# Patient Record
Sex: Female | Born: 1990 | Race: Black or African American | Hispanic: No | Marital: Married | Smoking: Never smoker
Health system: Southern US, Community
[De-identification: ages and names within clinical notes are randomized; demographics above are authoritative.]

## PROBLEM LIST (undated history)

## (undated) DIAGNOSIS — D696 Thrombocytopenia, unspecified: Secondary | ICD-10-CM

## (undated) DIAGNOSIS — G43909 Migraine, unspecified, not intractable, without status migrainosus: Secondary | ICD-10-CM

## (undated) DIAGNOSIS — B509 Plasmodium falciparum malaria, unspecified: Secondary | ICD-10-CM

## (undated) HISTORY — DX: Thrombocytopenia, unspecified: D69.6

## (undated) HISTORY — PX: NO PAST SURGERIES: SHX2092

## (undated) HISTORY — DX: Plasmodium falciparum malaria, unspecified: B50.9

---

## 2016-07-03 ENCOUNTER — Inpatient Hospital Stay (HOSPITAL_BASED_OUTPATIENT_CLINIC_OR_DEPARTMENT_OTHER)
Admission: EM | Admit: 2016-07-03 | Discharge: 2016-07-06 | DRG: 867 | Disposition: A | Discharge status: Home / Self Care | Payer: BLUE CROSS/BLUE SHIELD | Attending: Family Medicine | Admitting: Family Medicine

## 2016-07-03 ENCOUNTER — Emergency Department (HOSPITAL_BASED_OUTPATIENT_CLINIC_OR_DEPARTMENT_OTHER): Payer: BLUE CROSS/BLUE SHIELD

## 2016-07-03 ENCOUNTER — Encounter (HOSPITAL_BASED_OUTPATIENT_CLINIC_OR_DEPARTMENT_OTHER): Payer: Self-pay | Admitting: *Deleted

## 2016-07-03 DIAGNOSIS — E876 Hypokalemia: Secondary | ICD-10-CM | POA: Diagnosis present

## 2016-07-03 DIAGNOSIS — E871 Hypo-osmolality and hyponatremia: Secondary | ICD-10-CM | POA: Diagnosis present

## 2016-07-03 DIAGNOSIS — D62 Acute posthemorrhagic anemia: Secondary | ICD-10-CM | POA: Diagnosis present

## 2016-07-03 DIAGNOSIS — G43909 Migraine, unspecified, not intractable, without status migrainosus: Secondary | ICD-10-CM | POA: Diagnosis present

## 2016-07-03 DIAGNOSIS — D696 Thrombocytopenia, unspecified: Secondary | ICD-10-CM | POA: Diagnosis present

## 2016-07-03 DIAGNOSIS — G9340 Encephalopathy, unspecified: Secondary | ICD-10-CM | POA: Diagnosis present

## 2016-07-03 DIAGNOSIS — R600 Localized edema: Secondary | ICD-10-CM | POA: Diagnosis not present

## 2016-07-03 DIAGNOSIS — R609 Edema, unspecified: Secondary | ICD-10-CM

## 2016-07-03 DIAGNOSIS — B54 Unspecified malaria: Secondary | ICD-10-CM | POA: Diagnosis not present

## 2016-07-03 DIAGNOSIS — N920 Excessive and frequent menstruation with regular cycle: Secondary | ICD-10-CM | POA: Diagnosis present

## 2016-07-03 DIAGNOSIS — E872 Acidosis: Secondary | ICD-10-CM | POA: Diagnosis present

## 2016-07-03 DIAGNOSIS — R112 Nausea with vomiting, unspecified: Secondary | ICD-10-CM

## 2016-07-03 DIAGNOSIS — R946 Abnormal results of thyroid function studies: Secondary | ICD-10-CM | POA: Diagnosis not present

## 2016-07-03 DIAGNOSIS — R110 Nausea: Secondary | ICD-10-CM | POA: Diagnosis present

## 2016-07-03 DIAGNOSIS — B509 Plasmodium falciparum malaria, unspecified: Principal | ICD-10-CM | POA: Diagnosis present

## 2016-07-03 DIAGNOSIS — R51 Headache: Secondary | ICD-10-CM

## 2016-07-03 HISTORY — DX: Migraine, unspecified, not intractable, without status migrainosus: G43.909

## 2016-07-03 LAB — CBC WITH DIFFERENTIAL/PLATELET
Basophils Absolute: 0 10*3/uL (ref 0.0–0.1)
Basophils Relative: 0 %
EOS ABS: 0 10*3/uL (ref 0.0–0.7)
Eosinophils Relative: 0 %
HCT: 35.4 % — ABNORMAL LOW (ref 36.0–46.0)
Hemoglobin: 12.6 g/dL (ref 12.0–15.0)
LYMPHS ABS: 0.7 10*3/uL (ref 0.7–4.0)
Lymphocytes Relative: 11 %
MCH: 30.4 pg (ref 26.0–34.0)
MCHC: 35.6 g/dL (ref 30.0–36.0)
MCV: 85.3 fL (ref 78.0–100.0)
Monocytes Absolute: 0.6 10*3/uL (ref 0.1–1.0)
Monocytes Relative: 9 %
NEUTROS ABS: 5.4 10*3/uL (ref 1.7–7.7)
NEUTROS PCT: 80 %
PLATELETS: 28 10*3/uL — AB (ref 150–400)
RBC: 4.15 MIL/uL (ref 3.87–5.11)
RDW: 12.1 % (ref 11.5–15.5)
WBC: 6.8 10*3/uL (ref 4.0–10.5)

## 2016-07-03 LAB — URINALYSIS, ROUTINE W REFLEX MICROSCOPIC
BILIRUBIN URINE: NEGATIVE
Glucose, UA: NEGATIVE mg/dL
Leukocytes, UA: NEGATIVE
NITRITE: NEGATIVE
Protein, ur: 30 mg/dL — AB
SPECIFIC GRAVITY, URINE: 1.005 (ref 1.005–1.030)
pH: 5.5 (ref 5.0–8.0)

## 2016-07-03 LAB — COMPREHENSIVE METABOLIC PANEL
ALT: 42 U/L (ref 14–54)
ANION GAP: 12 (ref 5–15)
AST: 50 U/L — ABNORMAL HIGH (ref 15–41)
Albumin: 3.1 g/dL — ABNORMAL LOW (ref 3.5–5.0)
Alkaline Phosphatase: 82 U/L (ref 38–126)
BUN: 12 mg/dL (ref 6–20)
CHLORIDE: 103 mmol/L (ref 101–111)
CO2: 16 mmol/L — ABNORMAL LOW (ref 22–32)
CREATININE: 0.54 mg/dL (ref 0.44–1.00)
Calcium: 8.1 mg/dL — ABNORMAL LOW (ref 8.9–10.3)
Glucose, Bld: 91 mg/dL (ref 65–99)
POTASSIUM: 2.8 mmol/L — AB (ref 3.5–5.1)
Sodium: 131 mmol/L — ABNORMAL LOW (ref 135–145)
Total Bilirubin: 1.8 mg/dL — ABNORMAL HIGH (ref 0.3–1.2)
Total Protein: 6.4 g/dL — ABNORMAL LOW (ref 6.5–8.1)

## 2016-07-03 LAB — URINALYSIS, MICROSCOPIC (REFLEX)

## 2016-07-03 LAB — SAVE SMEAR

## 2016-07-03 LAB — HCG, SERUM, QUALITATIVE: PREG SERUM: NEGATIVE

## 2016-07-03 LAB — TSH: TSH: 0.279 u[IU]/mL — ABNORMAL LOW (ref 0.350–4.500)

## 2016-07-03 LAB — PARASITE EXAM SCREEN, BLOOD-W CONF TO LABCORP (NOT @ ARMC)

## 2016-07-03 LAB — I-STAT CG4 LACTIC ACID, ED: Lactic Acid, Venous: 1.11 mmol/L (ref 0.5–1.9)

## 2016-07-03 MED ORDER — SODIUM CHLORIDE 0.9 % IV BOLUS (SEPSIS)
1000.0000 mL | Freq: Once | INTRAVENOUS | Status: AC
  Administered 2016-07-03: 1000 mL via INTRAVENOUS

## 2016-07-03 MED ORDER — SODIUM CHLORIDE 0.9 % IV SOLN: INTRAVENOUS | Status: DC

## 2016-07-03 MED ORDER — ARTEMETHER-LUMEFANTRINE 20-120 MG PO TABS
4.0000 | ORAL_TABLET | Freq: Two times a day (BID) | ORAL | Status: AC
  Administered 2016-07-04 – 2016-07-05 (×4): 4 via ORAL
  Filled 2016-07-03 (×4): qty 4

## 2016-07-03 MED ORDER — ONDANSETRON HCL 4 MG PO TABS: 4.0000 mg | ORAL_TABLET | Freq: Four times a day (QID) | ORAL | Status: DC | PRN

## 2016-07-03 MED ORDER — ACETAMINOPHEN 500 MG PO TABS
1000.0000 mg | ORAL_TABLET | Freq: Once | ORAL | Status: AC
  Administered 2016-07-03: 1000 mg via ORAL

## 2016-07-03 MED ORDER — ACETAMINOPHEN 500 MG PO TABS
ORAL_TABLET | ORAL | Status: AC
  Filled 2016-07-03: qty 2

## 2016-07-03 MED ORDER — POTASSIUM CHLORIDE IN NACL 40-0.9 MEQ/L-% IV SOLN
INTRAVENOUS | Status: DC
  Administered 2016-07-03 – 2016-07-04 (×2): 125 mL/h via INTRAVENOUS
  Filled 2016-07-03 (×4): qty 1000

## 2016-07-03 MED ORDER — BISACODYL 5 MG PO TBEC: 5.0000 mg | DELAYED_RELEASE_TABLET | Freq: Every day | ORAL | Status: DC | PRN

## 2016-07-03 MED ORDER — POTASSIUM CHLORIDE CRYS ER 20 MEQ PO TBCR
40.0000 meq | EXTENDED_RELEASE_TABLET | Freq: Four times a day (QID) | ORAL | Status: AC
  Administered 2016-07-03 (×2): 40 meq via ORAL
  Filled 2016-07-03 (×2): qty 2

## 2016-07-03 MED ORDER — ARTEMETHER-LUMEFANTRINE 20-120 MG PO TABS
4.0000 | ORAL_TABLET | Freq: Three times a day (TID) | ORAL | Status: AC
  Administered 2016-07-03 (×2): 4 via ORAL
  Filled 2016-07-03 (×3): qty 4

## 2016-07-03 MED ORDER — ALBUTEROL SULFATE (2.5 MG/3ML) 0.083% IN NEBU: 2.5000 mg | INHALATION_SOLUTION | RESPIRATORY_TRACT | Status: DC | PRN

## 2016-07-03 MED ORDER — ACETAMINOPHEN 650 MG RE SUPP: 650.0000 mg | Freq: Four times a day (QID) | RECTAL | Status: DC | PRN

## 2016-07-03 MED ORDER — ACETAMINOPHEN 325 MG PO TABS
650.0000 mg | ORAL_TABLET | Freq: Four times a day (QID) | ORAL | Status: DC | PRN
  Administered 2016-07-04 – 2016-07-05 (×4): 650 mg via ORAL
  Filled 2016-07-03 (×4): qty 2

## 2016-07-03 MED ORDER — ONDANSETRON HCL 4 MG/2ML IJ SOLN
4.0000 mg | Freq: Four times a day (QID) | INTRAMUSCULAR | Status: DC | PRN
  Administered 2016-07-03 – 2016-07-04 (×3): 4 mg via INTRAVENOUS
  Filled 2016-07-03 (×3): qty 2

## 2016-07-03 NOTE — ED Notes (Addendum)
Attempted to call report to floor.  RN will call back with questions

## 2016-07-03 NOTE — Consult Note (Addendum)
Date of Admission:  07/03/2016  Date of Consult:  07/03/2016  Reason for Consult: Malaria Referring Physician: Dr. Candiss Norse   HPI: Beth Rollins is an 26 y.o. female who travelled to [place] from [place]. She and her husband took malaria prophylaxis but the patient herself did not take while in [place] because she ran out of medication. There was reportedly problem with their insurance and copay of meds.She arrived back in the [place] in the past 2 days. 5 days prior to admission she was already feeling poorly with headaches and nausea. She came to ED at [facility].blood work with TTPenia, hemolytic anemia. Parasite smear showed malaria. Patient was [facility] to [facility].   Dr. Candiss Norse had tried to order COARTEM in epic but apparently it is not built in and even after pharmacy consult entered it took several hours (including prompting from me). She took her first doses of medications this afternoon when I entered the room.  I examined her thin smear set up at [facility] and I calculated 0.06% parasitemia (if only counting ringed IC RBC forms) . I observed one RBC with 2 ring forms. I also observed many RBC with stubs on surface which is characteristic of Falciparum malaria.          Past Medical History:  Diagnosis Date  . Migraine     History reviewed. No pertinent surgical history.  Social History:  reports that she has never smoked. She does not have any smokeless tobacco history on file. She reports that she does not drink alcohol or use drugs.   History reviewed. No pertinent family history.  No Known Allergies   Medications: I have reviewed patients current medications as documented in Epic Anti-infectives    Start     Dose/Rate Route Frequency Ordered Stop   07/04/16 1000  artemether-lumefantrine (COARTEM) 20-120 MG tablet 4 tablet     4 tablet Oral 2 times daily 07/03/16 1414 07/06/16 0959   07/03/16 1430  artemether-lumefantrine (COARTEM) 20-120 MG tablet 4 tablet     4  tablet Oral Every 8 hours 07/03/16 1414 07/04/16 0629         ROS:  as in HPI  + fevers, chills, HA, nausea, malais otherwise remainder of 12 point Review of Systems is negative Blood pressure 115/63, pulse 88, temperature 98 F (36.7 C), temperature source Axillary, resp. rate 18, height '5\' 3"'$  (1.6 m), weight 112 lb 7 oz (51 kg), last menstrual period 07/02/2016, SpO2 100 %. General: Alert and awake, oriented x3, not in any acute distress. HEENT: anicteric sclera,  EOMI, oropharynx clear and without exudate Cardiovascular: tachy ate, normal r,  no murmur rubs or gallops Pulmonary: clear to auscultation bilaterally, no wheezing, rales or rhonchi Gastrointestinal: soft nontender, nondistended, normal bowel sounds, Musculoskeletal: no  clubbing or edema noted bilaterally Skin, soft tissue: no rashes Neuro: nonfocal, strength and sensation intact   Results for orders placed or performed during the hospital encounter of 07/03/16 (from the past 48 hour(s))  Comprehensive metabolic panel     Status: Abnormal   Collection Time: 07/03/16  8:28 AM  Result Value Ref Range   Sodium 131 (L) 135 - 145 mmol/L   Potassium 2.8 (L) 3.5 - 5.1 mmol/L   Chloride 103 101 - 111 mmol/L   CO2 16 (L) 22 - 32 mmol/L   Glucose, Bld 91 65 - 99 mg/dL   BUN 12 6 - 20 mg/dL   Creatinine, Ser 0.54 0.44 - 1.00 mg/dL  Calcium 8.1 (L) 8.9 - 10.3 mg/dL   Total Protein 6.4 (L) 6.5 - 8.1 g/dL   Albumin 3.1 (L) 3.5 - 5.0 g/dL   AST 50 (H) 15 - 41 U/L   ALT 42 14 - 54 U/L   Alkaline Phosphatase 82 38 - 126 U/L   Total Bilirubin 1.8 (H) 0.3 - 1.2 mg/dL   GFR calc non Af Amer >60 >60 mL/min   GFR calc Af Amer >60 >60 mL/min    Comment: (NOTE) The eGFR has been calculated using the CKD EPI equation. This calculation has not been validated in all clinical situations. eGFR's persistently <60 mL/min signify possible Chronic Kidney Disease.    Anion gap 12 5 - 15  CBC with Differential     Status: Abnormal    Collection Time: 07/03/16  8:28 AM  Result Value Ref Range   WBC 6.8 4.0 - 10.5 K/uL   RBC 4.15 3.87 - 5.11 MIL/uL   Hemoglobin 12.6 12.0 - 15.0 g/dL   HCT 35.4 (L) 36.0 - 46.0 %   MCV 85.3 78.0 - 100.0 fL   MCH 30.4 26.0 - 34.0 pg   MCHC 35.6 30.0 - 36.0 g/dL   RDW 12.1 11.5 - 15.5 %   Platelets 28 (LL) 150 - 400 K/uL    Comment: REPEATED TO VERIFY PLATELET COUNT CONFIRMED BY SMEAR CRITICAL RESULT CALLED TO, READ BACK BY AND VERIFIED WITH: BURNS AMY RN [phone] [id number] PHILLIPS C    Neutrophils Relative % 80 %   Neutro Abs 5.4 1.7 - 7.7 K/uL   Lymphocytes Relative 11 %   Lymphs Abs 0.7 0.7 - 4.0 K/uL   Monocytes Relative 9 %   Monocytes Absolute 0.6 0.1 - 1.0 K/uL   Eosinophils Relative 0 %   Eosinophils Absolute 0.0 0.0 - 0.7 K/uL   Basophils Relative 0 %   Basophils Absolute 0.0 0.0 - 0.1 K/uL   WBC Morphology TOXIC GRANULATION     Comment: VACUOLATED NEUTROPHILS  Parasite Exam Screen, Blood-w conf to [facility] (Not @ [facility])     Status: None   Collection Time: 07/03/16  8:28 AM  Result Value Ref Range   Parasite Exam Screen, Blood      CRITICAL RESULT CALLED TO, READ BACK BY AND VERIFIED WITH:    Comment: A. HARTLEY,RN [address]        Plasmodium or other Blood Parasites seen on thin smears. Sent to Reference Laboratory for identification and speciation. A. [id number],HK [id number] BY Rhea Bleacher Performed at [facility], [place] [address], [place], [place] [address]   hCG, serum, qualitative     Status: None   Collection Time: 07/03/16  8:28 AM  Result Value Ref Range   Preg, Serum NEGATIVE NEGATIVE    Comment:        THE SENSITIVITY OF THIS METHODOLOGY IS >10 mIU/mL.   I-Stat CG4 Lactic Acid, ED     Status: None   Collection Time: 07/03/16  8:35 AM  Result Value Ref Range   Lactic Acid, Venous 1.11 0.5 - 1.9 mmol/L  Urinalysis, Routine w reflex microscopic     Status: Abnormal   Collection Time: 07/03/16 10:32 AM  Result Value Ref Range    Color, Urine YELLOW YELLOW   APPearance CLEAR CLEAR   Specific Gravity, Urine 1.005 1.005 - 1.030   pH 5.5 5.0 - 8.0   Glucose, UA NEGATIVE NEGATIVE mg/dL   Hgb urine dipstick LARGE (A) NEGATIVE  Bilirubin Urine NEGATIVE NEGATIVE   Ketones, ur >80 (A) NEGATIVE mg/dL   Protein, ur 30 (A) NEGATIVE mg/dL   Nitrite NEGATIVE NEGATIVE   Leukocytes, UA NEGATIVE NEGATIVE  Urinalysis, Microscopic (reflex)     Status: Abnormal   Collection Time: 07/03/16 10:32 AM  Result Value Ref Range   RBC / HPF 6-30 0 - 5 RBC/hpf   WBC, UA 0-5 0 - 5 WBC/hpf   Bacteria, UA FEW (A) NONE SEEN   Squamous Epithelial / LPF 0-5 (A) NONE SEEN   Mucous PRESENT   TSH     Status: Abnormal   Collection Time: 07/03/16 12:25 PM  Result Value Ref Range   TSH 0.279 (L) 0.350 - 4.500 uIU/mL    Comment: Performed by a 3rd Generation assay with a functional sensitivity of <=0.01 uIU/mL.     )No results found for this or any previous visit (from the past 720 hour(s)).   Impression/Recommendation  Principal Problem:   Malaria Active Problems:   Hypokalemia   Hyponatremia   Beth Rollins is a 26 y.o. female with  Falciparum malaria  #1 Falciparum malaria:   Continue COARTEM  followup repeat smear in the am  Repeat CBC  IT WOULD BE A HIGHER STANDARD OF CARE TO HAVE SOMEONE CALCULATING THE % RBC WHO LOOKS AT SMEARS ROUTINELY rather than have this sent out.  My calculation based on observed ringed parasitized forms was .06%   Supportive care  I spent greater than 80  minutes with the patient including greater than 50% of time in face to face counsel of the patient re her malaria and in coordination of her care with RN, primary team, pharmacy, lab and in counting of parasites and examination of the smear personally.       07/03/2016, 5:07 PM   Thank you so much for this interesting consult  [facility] [place] [phone] (pager) [phone]  (office) 07/03/2016, 5:07 PM  Rhina Brackett Dam 07/03/2016, 5:07 PM

## 2016-07-03 NOTE — Progress Notes (Signed)
Pt had taken the Coartem and then the gave PO potassium. Pat had 1 vomiting episode after taking one potassium pill. No sign of medicine in vomit

## 2016-07-03 NOTE — ED Provider Notes (Signed)
[facility] Provider Note   CSN: [id number] Arrival date & time: 07/03/16  0734     History   Chief Complaint Chief Complaint  Patient presents with  . Fatigue    HPI Beth Rollins is a 26 y.o. female.  HPI Patient presents with suspected malaria. States she just returned from [place] last night. She was visiting family members that are in a refugee camp. For the last 3-4 days she's had fever headaches myalgias. States it feels like when she is previously had malaria. States he did go to the refugee camp but do not know of any other disease that they were exposed to. No cough. States she just feels worn out.   Past Medical History:  Diagnosis Date  . Migraine     There are no active problems to display for this patient.   History reviewed. No pertinent surgical history.  OB History    No data available       Home Medications    Prior to Admission medications   Not on File    Family History History reviewed. No pertinent family history.  Social History Social History  Substance Use Topics  . Smoking status: Never Smoker  . Smokeless tobacco: Not on file  . Alcohol use No     Allergies   Patient has no known allergies.   Review of Systems Review of Systems  Constitutional: Positive for fatigue and fever. Negative for appetite change.  HENT: Negative for facial swelling and sore throat.   Respiratory: Positive for shortness of breath.   Gastrointestinal: Positive for nausea and vomiting. Negative for abdominal pain.  Genitourinary: Negative for flank pain.  Musculoskeletal: Positive for myalgias.  Skin: Negative for rash and wound.  Neurological: Negative for numbness.  Psychiatric/Behavioral: Negative for agitation.     Physical Exam Updated Vital Signs BP (!) 107/56 (BP Location: Left Arm)   Pulse 98   Temp 99.8 F (37.7 C)   Resp 18   Ht 5\' 3"  (1.6 m)   Wt 51 kg (112 lb 7 oz)   LMP 07/02/2016   SpO2 98%   BMI 19.92  kg/m   Physical Exam  Constitutional: She appears well-developed.  HENT:  Head: Atraumatic.  Mouth/Throat: No oropharyngeal exudate.  Neck: Neck supple.  Cardiovascular:  Tachycardia  Pulmonary/Chest: Effort normal.  Abdominal: There is no tenderness.  Spleen may be mildly enlarged.  Musculoskeletal: She exhibits no edema.  Neurological: She is alert.  Skin: Skin is warm. Capillary refill takes less than 2 seconds.  Psychiatric: She has a normal mood and affect.     ED Treatments / Results  Labs (all labs ordered are listed, but only abnormal results are displayed) Labs Reviewed  COMPREHENSIVE METABOLIC PANEL - Abnormal; Notable for the following:       Result Value   Sodium 131 (*)    Potassium 2.8 (*)    CO2 16 (*)    Calcium 8.1 (*)    Total Protein 6.4 (*)    Albumin 3.1 (*)    AST 50 (*)    Total Bilirubin 1.8 (*)    All other components within normal limits  CBC WITH DIFFERENTIAL/PLATELET - Abnormal; Notable for the following:    HCT 35.4 (*)    Platelets 28 (*)    All other components within normal limits  CULTURE, BLOOD (ROUTINE X 2)  CULTURE, BLOOD (ROUTINE X 2)  PARASITE EXAM SCREEN, BLOOD-W CONF TO [facility] (NOT @ [facility])  PREGNANCY, URINE  URINALYSIS, ROUTINE W REFLEX MICROSCOPIC  I-STAT CG4 LACTIC ACID, ED    EKG  EKG Interpretation None       Radiology Dg Chest 2 View  Result Date: 07/03/2016 CLINICAL DATA:  Fifteen fever 5 days. EXAM: CHEST  2 VIEW COMPARISON:  None. FINDINGS: The heart size and mediastinal contours are within normal limits. Both lungs are clear. The visualized skeletal structures are unremarkable. IMPRESSION: No active cardiopulmonary disease. Electronically Signed   By: Elige Ko   On: 07/03/2016 08:30    Procedures Procedures (including critical care time)  Medications Ordered in ED Medications  sodium chloride 0.9 % bolus 1,000 mL (1,000 mLs Intravenous New Bag/Given 07/03/16 0919)  acetaminophen (TYLENOL) tablet  1,000 mg (1,000 mg Oral Given 07/03/16 0753)  sodium chloride 0.9 % bolus 1,000 mL (0 mLs Intravenous Stopped 07/03/16 0919)     Initial Impression / Assessment and Plan / ED Course  I have reviewed the triage vital signs and the nursing notes.  Pertinent labs & imaging results that were available during my care of the patient were reviewed by me and considered in my medical decision making (see chart for details).   patient presented with suspected malaria. Just returned from [place]. Has had malaria in the past. Discussed with Dr. Ilsa Iha from infectious disease. Patient does have a thrombocytopenia. Still feels weak after initial IV fluids. At this point I think she would benefit from an admission. Normal lactic acid. Will admit to [facility]. Will be followed by Dr. Zenaida Niece dam. I do  not have anti-malaria drugs here and they will need to be started once arriving to [facility].  Final Clinical Impressions(s) / ED Diagnoses   Final diagnoses:  Malaria  Thrombocytopenia [facility])    New Prescriptions New Prescriptions   No medications on file     Benjiman Core, MD 07/03/16 [phone]

## 2016-07-03 NOTE — ED Triage Notes (Signed)
Pt reports that she returned from [place] last night. States that she thinks that she has malaria.  Fever, HA, body aches, nausea x 3-4 days.  Ambulatory. Denies other symptoms.

## 2016-07-03 NOTE — ED Notes (Signed)
Paged [facility] via [facility] @ 9:32 am

## 2016-07-03 NOTE — H&P (Signed)
[facility] H&P   Patient Demographics:    Beth Rollins, is a 26 y.o. female  MRN: [id number]   DOB - 30-Aug-1990  Admit Date - 07/03/2016  Outpatient Primary MD for the patient is Patient, No Pcp Per    Patient coming from: Home  Chief Complaint  Patient presents with  . Fatigue      HPI:    Beth Rollins  is a 26 y.o. female, No previous medical history, who is recently moved from [place] a few days ago, recently was at a refugee camp there is well visiting family. She was exposed to multiple mosquito bites. She presented to [facility] with chief complaints of fever which comes off and on every few hours associated with right years but no sweats, mostly headaches and mild nausea. In the ER blood work suggested severe thrombocytopenia, hypokalemia and hyponatremia. Case was discussed by the ER physician with ID physician Dr. Ilsa Iha and went them. Blood smear showed possible parasite per pathology technician, presumptive diagnosis of malaria was obtained and patient was admitted to [facility] under my service.  She currently has generalized headache, no body aches, no hematuria, no abdominal pain, no bruises. Besides above dictated review of systems other review of systems negative    Review of systems:    In addition to the HPI above,   Positive Fever-chills, +ve Headache, No changes with Vision or hearing, No problems swallowing food or Liquids, No Chest pain, Cough or Shortness of Breath, No Abdominal pain, No Nausea or Vommitting, Bowel movements are regular, No Blood in stool or Urine, No dysuria, No new skin rashes or bruises, No new joints pains-aches,  No new weakness, tingling, numbness in  any extremity, No recent weight gain or loss, No polyuria, polydypsia or polyphagia, No significant Mental Stressors.  A full 10 point Review of Systems was done, except as stated above, all other Review of Systems were negative.   With Past History of the following :    Past Medical History:  Diagnosis Date  . Migraine       History reviewed. No pertinent surgical history.    Social History:     Social History  Substance Use Topics  . Smoking status: Never Smoker  . Smokeless tobacco: Not on file  . Alcohol use No  Family History :   No history of hematological malignancies   Home Medications:   Prior to Admission medications   Not on File     Allergies:    No Known Allergies   Physical Exam:   Vitals  Blood pressure (!) 98/49, pulse 87, temperature 98.3 F (36.8 C), temperature source Axillary, resp. rate 20, height 5\' 3"  (1.6 m), weight 51 kg (112 lb 7 oz), last menstrual period 07/02/2016, SpO2 99 %.   1. General Young thin African-American female lying in hospital bed in no apparent distress,  2. Normal affect and insight, Not Suicidal or Homicidal, Awake Alert, Oriented X 3.  3. No F.N deficits, ALL C.Nerves Intact, Strength 5/5 all 4 extremities, Sensation intact all 4 extremities, Plantars down going.  4. Ears and Eyes appear Normal, Conjunctivae clear, PERRLA. Moist Oral Mucosa.  5. Supple Neck, No JVD, No cervical lymphadenopathy appriciated, No Carotid Bruits.  6. Symmetrical Chest wall movement, Good air movement bilaterally, CTAB.  7. RRR, No Gallops, Rubs or Murmurs, No Parasternal Heave.  8. Positive Bowel Sounds, Abdomen Soft, No tenderness, No organomegaly appriciated,No rebound -guarding or rigidity.  9.  No Cyanosis, Normal Skin Turgor, No Skin Rash or Bruise.  10. Good muscle tone,  joints appear normal , no effusions, Normal ROM.  11. No Palpable Lymph Nodes in Neck or Axillae      Data Review:    CBC  Recent  Labs Lab 07/03/16 0828  WBC 6.8  HGB 12.6  HCT 35.4*  PLT 28*  MCV 85.3  MCH 30.4  MCHC 35.6  RDW 12.1  LYMPHSABS 0.7  MONOABS 0.6  EOSABS 0.0  BASOSABS 0.0   ------------------------------------------------------------------------------------------------------------------  Chemistries   Recent Labs Lab 07/03/16 0828  NA 131*  K 2.8*  CL 103  CO2 16*  GLUCOSE 91  BUN 12  CREATININE 0.54  CALCIUM 8.1*  AST 50*  ALT 42  ALKPHOS 82  BILITOT 1.8*   ------------------------------------------------------------------------------------------------------------------ estimated creatinine clearance is 85 mL/min (by C-G formula based on SCr of 0.54 mg/dL). ------------------------------------------------------------------------------------------------------------------ No results for input(s): TSH, T4TOTAL, T3FREE, THYROIDAB in the last 72 hours.  Invalid input(s): FREET3  Coagulation profile No results for input(s): INR, PROTIME in the last 168 hours. ------------------------------------------------------------------------------------------------------------------- No results for input(s): DDIMER in the last 72 hours. -------------------------------------------------------------------------------------------------------------------  Cardiac Enzymes No results for input(s): CKMB, TROPONINI, MYOGLOBIN in the last 168 hours.  Invalid input(s): CK ------------------------------------------------------------------------------------------------------------------ No results found for: BNP   ---------------------------------------------------------------------------------------------------------------  Urinalysis    Component Value Date/Time   COLORURINE YELLOW 07/03/2016 1032   APPEARANCEUR CLEAR 07/03/2016 1032   LABSPEC 1.005 07/03/2016 1032   PHURINE 5.5 07/03/2016 1032   GLUCOSEU NEGATIVE 07/03/2016 1032   HGBUR LARGE (A) 07/03/2016 1032   BILIRUBINUR NEGATIVE  07/03/2016 1032   KETONESUR >80 (A) 07/03/2016 1032   PROTEINUR 30 (A) 07/03/2016 1032   NITRITE NEGATIVE 07/03/2016 1032   LEUKOCYTESUR NEGATIVE 07/03/2016 1032    ----------------------------------------------------------------------------------------------------------------   Imaging Results:    Dg Chest 2 View  Result Date: 07/03/2016 CLINICAL DATA:  Fifteen fever 5 days. EXAM: CHEST  2 VIEW COMPARISON:  None. FINDINGS: The heart size and mediastinal contours are within normal limits. Both lungs are clear. The visualized skeletal structures are unremarkable. IMPRESSION: No active cardiopulmonary disease. Electronically Signed   By: Elige KoHetal  Patel   On: 07/03/2016 08:30        Assessment & Plan:      1. Possible malaria. Cannot rule out dengue at this  time. Patient to be admitted, ID consulted, blood smear will be repeated, ID physician Dr. Algis Liming will evaluate the smear. 4 dengue that is supportive care which will be continued, it screen will be done, if platelet counts drop dangerously with evidence of bleeding she can get platelet transfusion, she is at risk for falciparum malaria further treatment will be deferred to ID.  2. Hyponatremia and hypokalemia. Hydrate with IV fluids, replace IV and oral potassium.  3. Headache. Due to #1 above. Supportive care. No meningismus or photophobia.    DVT Prophylaxis  SCDs    AM Labs Ordered, also please review Full Orders  Family Communication: Admission, patients condition and plan of care including tests being ordered have been discussed with the patient and husband who indicate understanding and agree with the plan and Code Status.  Code Status Full  Likely DC to  Home   Condition GUARDED    Consults called: ID    Admission status: Inpt    Time spent in minutes : 35   Susa Raring M.D on 07/03/2016 at 12:14 PM  Between 7am to 7pm - Pager - [phone] ( page via [facility], text pages only, please mention full 10 digit  call back number).  After 7pm go to [email or web address] - password [facility]  [facility] - Office  [phone]

## 2016-07-04 ENCOUNTER — Encounter (HOSPITAL_COMMUNITY): Payer: Self-pay | Admitting: *Deleted

## 2016-07-04 DIAGNOSIS — E872 Acidosis: Secondary | ICD-10-CM

## 2016-07-04 DIAGNOSIS — R609 Edema, unspecified: Secondary | ICD-10-CM

## 2016-07-04 DIAGNOSIS — R112 Nausea with vomiting, unspecified: Secondary | ICD-10-CM

## 2016-07-04 DIAGNOSIS — R110 Nausea: Secondary | ICD-10-CM

## 2016-07-04 DIAGNOSIS — R51 Headache: Secondary | ICD-10-CM

## 2016-07-04 DIAGNOSIS — R600 Localized edema: Secondary | ICD-10-CM

## 2016-07-04 LAB — BASIC METABOLIC PANEL
ANION GAP: 8 (ref 5–15)
BUN: 10 mg/dL (ref 6–20)
CO2: 16 mmol/L — AB (ref 22–32)
Calcium: 7.9 mg/dL — ABNORMAL LOW (ref 8.9–10.3)
Chloride: 111 mmol/L (ref 101–111)
Creatinine, Ser: 0.45 mg/dL (ref 0.44–1.00)
GFR calc non Af Amer: >60 mL/min (ref >60)
GLUCOSE: 82 mg/dL (ref 65–99)
Potassium: 4.3 mmol/L (ref 3.5–5.1)
Sodium: 135 mmol/L (ref 135–145)

## 2016-07-04 LAB — CBC
HCT: 35.1 % — ABNORMAL LOW (ref 36.0–46.0)
HCT: 30.1 % — ABNORMAL LOW (ref 36.0–46.0)
HCT: 28.8 % — ABNORMAL LOW (ref 36.0–46.0)
HEMOGLOBIN: 9.9 g/dL — AB (ref 12.0–15.0)
Hemoglobin: 12 g/dL (ref 12.0–15.0)
Hemoglobin: 10.3 g/dL — ABNORMAL LOW (ref 12.0–15.0)
MCH: 29.8 pg (ref 26.0–34.0)
MCH: 29 pg (ref 26.0–34.0)
MCH: 28.9 pg (ref 26.0–34.0)
MCHC: 34.2 g/dL (ref 30.0–36.0)
MCHC: 34.2 g/dL (ref 30.0–36.0)
MCHC: 34.4 g/dL (ref 30.0–36.0)
MCV: 87.1 fL (ref 78.0–100.0)
MCV: 84.8 fL (ref 78.0–100.0)
MCV: 84.2 fL (ref 78.0–100.0)
PLATELETS: 40 10*3/uL — AB (ref 150–400)
Platelets: 19 10*3/uL — CL (ref 150–400)
Platelets: 22 10*3/uL — CL (ref 150–400)
RBC: 4.03 MIL/uL (ref 3.87–5.11)
RBC: 3.55 MIL/uL — ABNORMAL LOW (ref 3.87–5.11)
RBC: 3.42 MIL/uL — ABNORMAL LOW (ref 3.87–5.11)
RDW: 13.6 % (ref 11.5–15.5)
RDW: 13.4 % (ref 11.5–15.5)
RDW: 13.5 % (ref 11.5–15.5)
WBC: 5.4 10*3/uL (ref 4.0–10.5)
WBC: 4.6 10*3/uL (ref 4.0–10.5)
WBC: 4.1 10*3/uL (ref 4.0–10.5)

## 2016-07-04 LAB — MRSA PCR SCREENING: MRSA BY PCR: NEGATIVE

## 2016-07-04 LAB — HEPATIC FUNCTION PANEL
ALBUMIN: 2.6 g/dL — AB (ref 3.5–5.0)
ALK PHOS: 65 U/L (ref 38–126)
ALT: 34 U/L (ref 14–54)
AST: 41 U/L (ref 15–41)
BILIRUBIN INDIRECT: 0.7 mg/dL (ref 0.3–0.9)
Bilirubin, Direct: 0.5 mg/dL (ref 0.1–0.5)
Total Bilirubin: 1.2 mg/dL (ref 0.3–1.2)
Total Protein: 5.7 g/dL — ABNORMAL LOW (ref 6.5–8.1)

## 2016-07-04 LAB — BLOOD GAS, ARTERIAL
Acid-base deficit: 6.5 mmol/L — ABNORMAL HIGH (ref 0.0–2.0)
Bicarbonate: 16.5 mmol/L — ABNORMAL LOW (ref 20.0–28.0)
DRAWN BY: 331471
O2 SAT: 97.5 %
PATIENT TEMPERATURE: 98.6
pCO2 arterial: 26.2 mmHg — ABNORMAL LOW (ref 32.0–48.0)
pH, Arterial: 7.416 (ref 7.350–7.450)
pO2, Arterial: 95.7 mmHg (ref 83.0–108.0)

## 2016-07-04 LAB — LACTIC ACID, PLASMA
LACTIC ACID, VENOUS: 1.2 mmol/L (ref 0.5–1.9)
LACTIC ACID, VENOUS: 1.3 mmol/L (ref 0.5–1.9)

## 2016-07-04 LAB — GLUCOSE, CAPILLARY
Glucose-Capillary: 149 mg/dL — ABNORMAL HIGH (ref 65–99)
Glucose-Capillary: 203 mg/dL — ABNORMAL HIGH (ref 65–99)
Glucose-Capillary: 134 mg/dL — ABNORMAL HIGH (ref 65–99)

## 2016-07-04 LAB — T4, FREE: Free T4: 1.24 ng/dL — ABNORMAL HIGH (ref 0.61–1.12)

## 2016-07-04 LAB — ABO/RH: ABO/RH(D): A POS

## 2016-07-04 LAB — HIV ANTIBODY (ROUTINE TESTING W REFLEX): HIV Screen 4th Generation wRfx: NONREACTIVE

## 2016-07-04 LAB — TYPE AND SCREEN
ABO/RH(D): A POS
ANTIBODY SCREEN: NEGATIVE

## 2016-07-04 LAB — SAVE SMEAR

## 2016-07-04 MED ORDER — HYDROCODONE-ACETAMINOPHEN 5-325 MG PO TABS
1.0000 | ORAL_TABLET | Freq: Four times a day (QID) | ORAL | Status: DC | PRN
  Administered 2016-07-04: 1 via ORAL
  Filled 2016-07-04: qty 1

## 2016-07-04 MED ORDER — SODIUM BICARBONATE 8.4 % IV SOLN
INTRAVENOUS | Status: DC
  Administered 2016-07-04: 12:00:00 via INTRAVENOUS
  Filled 2016-07-04 (×2): qty 150

## 2016-07-04 MED ORDER — SODIUM CHLORIDE 0.9 % IV SOLN: INTRAVENOUS | Status: DC

## 2016-07-04 MED ORDER — SODIUM CHLORIDE 0.9 % IV BOLUS (SEPSIS)
500.0000 mL | Freq: Once | INTRAVENOUS | Status: AC
  Administered 2016-07-04: 500 mL via INTRAVENOUS

## 2016-07-04 MED ORDER — ACETAMINOPHEN 325 MG PO TABS
325.0000 mg | ORAL_TABLET | Freq: Once | ORAL | Status: AC
  Administered 2016-07-04: 325 mg via ORAL
  Filled 2016-07-04: qty 1

## 2016-07-04 MED ORDER — SODIUM BICARBONATE 8.4 % IV SOLN
INTRAVENOUS | Status: DC
  Administered 2016-07-04 – 2016-07-05 (×2): via INTRAVENOUS
  Filled 2016-07-04: qty 150

## 2016-07-04 MED ORDER — STERILE WATER FOR INJECTION IV SOLN
INTRAVENOUS | Status: DC
  Filled 2016-07-04: qty 9.71

## 2016-07-04 NOTE — Progress Notes (Signed)
CRITICAL VALUE ALERT  Critical Value:  Platelets 19   Date & Time Notied:  07/04/16 @ 1654  Provider Notified: Dr. Sunnie Nielsenegalado @ 1655  Orders Received/Actions taken: No new orders at this time. Will continue to monitor pt closely.

## 2016-07-04 NOTE — Progress Notes (Signed)
Subjective:  "I'm not getting better, my face is bigger and my headache is still very bad"   Antibiotics:  Anti-infectives    Start     Dose/Rate Route Frequency Ordered Stop   07/04/16 0830  artemether-lumefantrine (COARTEM) 20-120 MG tablet 4 tablet     4 tablet Oral 2 times daily 07/03/16 1414 07/06/16 0759   07/03/16 1430  artemether-lumefantrine (COARTEM) 20-120 MG tablet 4 tablet     4 tablet Oral Every 8 hours 07/03/16 1414 07/03/16 2334      Medications: Scheduled Meds: . artemether-lumefantrine  4 tablet Oral BID   Continuous Infusions: . 0.9 % NaCl with KCl 40 mEq / L 125 mL/hr (07/04/16 0932)   PRN Meds:.acetaminophen **OR** acetaminophen, albuterol, bisacodyl, HYDROcodone-acetaminophen, ondansetron **OR** ondansetron (ZOFRAN) IV    Objective: Weight change:   Intake/Output Summary (Last 24 hours) at 07/04/16 0939 Last data filed at 07/04/16 0929  Gross per 24 hour  Intake          2141.25 ml  Output                0 ml  Net          2141.25 ml   Blood pressure (!) 117/43, pulse (!) 102, temperature 99.4 F (37.4 C), temperature source Oral, resp. rate 20, height 5\' 3"  (1.6 m), weight 121 lb 4.1 oz (55 kg), last menstrual period 07/02/2016, SpO2 100 %. Temp:  [98 F (36.7 C)-99.4 F (37.4 C)] 99.4 F (37.4 C) (05/29 0828) Pulse Rate:  [75-102] 102 (05/29 0828) Resp:  [18-20] 20 (05/29 0828) BP: (89-117)/(43-72) 117/43 (05/29 0828) SpO2:  [98 %-100 %] 100 % (05/29 0828) Weight:  [121 lb 4.1 oz (55 kg)] 121 lb 4.1 oz (55 kg) (05/29 0445)  Physical Exam: General: Alert and awake, oriented x3 tearful anxious. HEENT: anicteric sclera, pupils reactive to light and accommodation, EOMI CVS tachy  regular rate,  Chest: no wheezing, resp distres Abdomen: soft , nondistended, normal bowel sounds, Extremities:1 + edema Skin: no rashes Neuro: nonfocal  CBC:  CBC Latest Ref Rng & Units 07/04/2016 07/03/2016  WBC 4.0 - 10.5 K/uL 5.4 6.8  Hemoglobin  12.0 - 15.0 g/dL 16.112.0 09.612.6  Hematocrit 04.536.0 - 46.0 % 35.1(L) 35.4(L)  Platelets 150 - 400 K/uL 40(L) 28(LL)      BMET  Recent Labs  07/03/16 0828 07/04/16 0625  NA 131* 135  K 2.8* 4.3  CL 103 111  CO2 16* 16*  GLUCOSE 91 82  BUN 12 10  CREATININE 0.54 0.45  CALCIUM 8.1* 7.9*     Liver Panel   Recent Labs  07/03/16 0828  PROT 6.4*  ALBUMIN 3.1*  AST 50*  ALT 42  ALKPHOS 82  BILITOT 1.8*       Sedimentation Rate No results for input(s): ESRSEDRATE in the last 72 hours. C-Reactive Protein No results for input(s): CRP in the last 72 hours.  Micro Results: No results found for this or any previous visit (from the past 720 hour(s)).  Studies/Results: Dg Chest 2 View  Result Date: 07/03/2016 CLINICAL DATA:  Fifteen fever 5 days. EXAM: CHEST  2 VIEW COMPARISON:  None. FINDINGS: The heart size and mediastinal contours are within normal limits. Both lungs are clear. The visualized skeletal structures are unremarkable. IMPRESSION: No active cardiopulmonary disease. Electronically Signed   By: Elige KoHetal  Patel   On: 07/03/2016 08:30      Assessment/Plan:  INTERVAL HISTORY: fever curve improved,  k corrected, TTpenia improving   Principal Problem:   Malaria Active Problems:   Hypokalemia   Hyponatremia   Thrombocytopenia (HCC)   Falciparum malaria    Beth Rollins is a 26 y.o. female with  Falciparum malaria  #1 Falciparum malaria:   Continue COARTEM  Platelets better  I will see if they have a smear I can look at personally as well this am  See prior comments re lack of having % parasitemias read on these smears formally at [facility]  #2 Headache: suspect due to dehydration, illness, etc. I do NOT think she has evidence of cerebral malaria  I will order hydrocodone for pain control  #3 Nausea: continue zofran and premedicate prior to antimalarials  #4 PERCEIVED facial edema: she is assuredly 3rd spacing with inflammation and IVF.      LOS:  1 day   Acey Lav 07/04/2016, 9:39 AM

## 2016-07-04 NOTE — Progress Notes (Addendum)
PROGRESS NOTE    Beth Rollins  ZOX:[id number] DOB: September 06, 1990 DOA: 07/03/2016 PCP: Patient, No Pcp Per    Brief Narrative:  Beth Rollins  is a 26 y.o. female, No previous medical history, who is recently moved from [place] a few days ago, recently was at a refugee [place] there is well visiting family. She was exposed to multiple mosquito bites. She presented to [facility] with chief complaints of fever which comes off and on every few hours associated with right years but no sweats, mostly headaches and mild nausea. In the ER blood work suggested severe thrombocytopenia, hypokalemia and hyponatremia. Case was discussed by the ER physician with ID physician Dr. Ilsa Iha and went them. Blood smear showed possible parasite per pathology technician, presumptive diagnosis of malaria was obtained and patient was admitted to [facility] under my service.  She currently has generalized headache, no body aches, no hematuria, no abdominal pain, no bruises. Besides above dictated review of systems other review of systems negative  Assessment & Plan:   Principal Problem:   Malaria Active Problems:   Hypokalemia   Hyponatremia   Thrombocytopenia (HCC)   Falciparum malaria   Acute nonintractable headache   Edema   Non-intractable vomiting with nausea  1-Falciparum Malaria;  Platelet count at 40 increased from 35. Hb sable.  Will repeat LFT.  Acidosis on labs. Check lactic acid. ABG.  Will start IV fluids with bicarb.  Monitor for hypoglycemia.  I called pathology, they will do Count % of parasite.  ID following.  Started on Coartem.  Hypotensive this morning, improved with IV fluids.  Platelet decreased. Patient having menstrual period but not significant bleeding. Will monitor closely. Might need platelet transfusion if bleeding gets worse.  Will repeat CBC in 6 hours. Will also check peripheral smear. Discussed with ID.   2-Headaches; relates similar to her Migraines.    Vicodin PRN.   3-Low TSH; check Free 3 and free T 4.   4-Hyponatremia, Hypokalemia; replaced.   5-Encephalopathy; she is slowly  in answering question, somnolent. will transfer to monitor neuro exam and MS>     DVT prophylaxis: SCD, avoid anticoagulation due to thrombocytopenia.  Code Status: full code.  Family Communication: Care discussed with husband.  Disposition Plan: Remain in the hospital    Consultants:   ID    Procedures: none   Antimicrobials: Coartem    Subjective: She was walking early today in the hall. Feels tired now, sleepy. She follows command,. Moves extremities passively.  She is complaining of headaches. Feels like her migraine. She takes ibuprofen at home for the same.  She ate breakfast   Objective: Vitals:   07/03/16 1300 07/03/16 2020 07/04/16 0445 07/04/16 0828  BP: 115/63 (!) 93/51 (!) 89/72 (!) 117/43  Pulse: 88 75 88 (!) 102  Resp: 18 18 18 20   Temp: 98 F (36.7 C) 98 F (36.7 C) 98.7 F (37.1 C) 99.4 F (37.4 C)  TempSrc: Axillary Oral Oral Oral  SpO2: 100% 100% 100% 100%  Weight:   55 kg (121 lb 4.1 oz)   Height:        Intake/Output Summary (Last 24 hours) at 07/04/16 1152 Last data filed at 07/04/16 0929  Gross per 24 hour  Intake          2141.25 ml  Output                0 ml  Net  2141.25 ml   Filed Weights   07/03/16 0746 07/04/16 0445  Weight: 51 kg (112 lb 7 oz) 55 kg (121 lb 4.1 oz)    Examination:  General exam: Appears calm and comfortable , sleepy  Respiratory system: Clear to auscultation. Respiratory effort normal. Cardiovascular system: S1 & S2 heard, RRR. No JVD, murmurs, rubs, gallops or clicks. No pedal edema. Gastrointestinal system: Abdomen is nondistended, soft and nontender. No organomegaly or masses felt. Normal bowel sounds heard. Central nervous system: Answer questions, follows command, appears sleepy. Complaining of headaches.  Extremities: moves extremities passively  Skin: No  rashes, lesions or ulcers     Data Reviewed: I have personally reviewed following labs and imaging studies  CBC:  Recent Labs Lab 07/03/16 0828 07/04/16 0625  WBC 6.8 5.4  NEUTROABS 5.4  --   HGB 12.6 12.0  HCT 35.4* 35.1*  MCV 85.3 87.1  PLT 28* 40*   Basic Metabolic Panel:  Recent Labs Lab 07/03/16 0828 07/04/16 0625  NA 131* 135  K 2.8* 4.3  CL 103 111  CO2 16* 16*  GLUCOSE 91 82  BUN 12 10  CREATININE 0.54 0.45  CALCIUM 8.1* 7.9*   GFR: Estimated Creatinine Clearance: 87.4 mL/min (by C-G formula based on SCr of 0.45 mg/dL). Liver Function Tests:  Recent Labs Lab 07/03/16 0828  AST 50*  ALT 42  ALKPHOS 82  BILITOT 1.8*  PROT 6.4*  ALBUMIN 3.1*   No results for input(s): LIPASE, AMYLASE in the last 168 hours. No results for input(s): AMMONIA in the last 168 hours. Coagulation Profile: No results for input(s): INR, PROTIME in the last 168 hours. Cardiac Enzymes: No results for input(s): CKTOTAL, CKMB, CKMBINDEX, TROPONINI in the last 168 hours. BNP (last 3 results) No results for input(s): PROBNP in the last 8760 hours. HbA1C: No results for input(s): HGBA1C in the last 72 hours. CBG:  Recent Labs Lab 07/04/16 1125  GLUCAP 149*   Lipid Profile: No results for input(s): CHOL, HDL, LDLCALC, TRIG, CHOLHDL, LDLDIRECT in the last 72 hours. Thyroid Function Tests:  Recent Labs  07/03/16 1225  TSH 0.279*   Anemia Panel: No results for input(s): VITAMINB12, FOLATE, FERRITIN, TIBC, IRON, RETICCTPCT in the last 72 hours. Sepsis Labs:  Recent Labs Lab 07/03/16 0835  LATICACIDVEN 1.11    No results found for this or any previous visit (from the past 240 hour(s)).       Radiology Studies: Dg Chest 2 View  Result Date: 07/03/2016 CLINICAL DATA:  Fifteen fever 5 days. EXAM: CHEST  2 VIEW COMPARISON:  None. FINDINGS: The heart size and mediastinal contours are within normal limits. Both lungs are clear. The visualized skeletal structures  are unremarkable. IMPRESSION: No active cardiopulmonary disease. Electronically Signed   By: Elige KoHetal  Patel   On: 07/03/2016 08:30        Scheduled Meds: . artemether-lumefantrine  4 tablet Oral BID   Continuous Infusions: .  sodium bicarbonate  infusion 1000 mL       LOS: 1 day    Time spent: 35 minutes.     Alba Coryegalado, Bowen Goyal A, MD [facility] Pager [phone]  If 7PM-7AM, please contact night-coverage [email or web address] Password [email or web address] 07/04/2016, 11:52 AM

## 2016-07-05 DIAGNOSIS — N920 Excessive and frequent menstruation with regular cycle: Secondary | ICD-10-CM

## 2016-07-05 LAB — CBC
HCT: 28.1 % — ABNORMAL LOW (ref 36.0–46.0)
HEMOGLOBIN: 10 g/dL — AB (ref 12.0–15.0)
MCH: 29.9 pg (ref 26.0–34.0)
MCHC: 35.6 g/dL (ref 30.0–36.0)
MCV: 84.1 fL (ref 78.0–100.0)
PLATELETS: 26 10*3/uL — AB (ref 150–400)
RBC: 3.34 MIL/uL — ABNORMAL LOW (ref 3.87–5.11)
RDW: 13.4 % (ref 11.5–15.5)
WBC: 5.1 10*3/uL (ref 4.0–10.5)

## 2016-07-05 LAB — GLUCOSE, CAPILLARY
GLUCOSE-CAPILLARY: 119 mg/dL — AB (ref 65–99)
GLUCOSE-CAPILLARY: 118 mg/dL — AB (ref 65–99)
GLUCOSE-CAPILLARY: 82 mg/dL (ref 65–99)
Glucose-Capillary: 126 mg/dL — ABNORMAL HIGH (ref 65–99)

## 2016-07-05 LAB — COMPREHENSIVE METABOLIC PANEL
ALBUMIN: 2.1 g/dL — AB (ref 3.5–5.0)
ALT: 33 U/L (ref 14–54)
ANION GAP: 6 (ref 5–15)
AST: 41 U/L (ref 15–41)
Alkaline Phosphatase: 62 U/L (ref 38–126)
BUN: <5 mg/dL — ABNORMAL LOW (ref 6–20)
CHLORIDE: 105 mmol/L (ref 101–111)
CO2: 26 mmol/L (ref 22–32)
Calcium: 7.4 mg/dL — ABNORMAL LOW (ref 8.9–10.3)
Creatinine, Ser: 0.41 mg/dL — ABNORMAL LOW (ref 0.44–1.00)
GFR calc non Af Amer: >60 mL/min (ref >60)
GLUCOSE: 131 mg/dL — AB (ref 65–99)
POTASSIUM: 3.2 mmol/L — AB (ref 3.5–5.1)
SODIUM: 137 mmol/L (ref 135–145)
Total Bilirubin: 0.7 mg/dL (ref 0.3–1.2)
Total Protein: 5.2 g/dL — ABNORMAL LOW (ref 6.5–8.1)

## 2016-07-05 LAB — SAVE SMEAR

## 2016-07-05 LAB — T3, FREE: T3 FREE: 2.4 pg/mL (ref 2.0–4.4)

## 2016-07-05 MED ORDER — SODIUM CHLORIDE 0.9 % IV BOLUS (SEPSIS)
1000.0000 mL | Freq: Once | INTRAVENOUS | Status: AC
  Administered 2016-07-05: 1000 mL via INTRAVENOUS

## 2016-07-05 MED ORDER — POTASSIUM CHLORIDE CRYS ER 20 MEQ PO TBCR
40.0000 meq | EXTENDED_RELEASE_TABLET | Freq: Once | ORAL | Status: AC
  Administered 2016-07-05: 40 meq via ORAL
  Filled 2016-07-05: qty 2

## 2016-07-05 MED ORDER — SODIUM CHLORIDE 0.9 % IV BOLUS (SEPSIS)
500.0000 mL | Freq: Once | INTRAVENOUS | Status: AC
  Administered 2016-07-05: 500 mL via INTRAVENOUS

## 2016-07-05 NOTE — Progress Notes (Signed)
Pt to transfer to [id number]. Pt resting in bed at this time with husband at bedside. Report given to [facility] Hospital[facility]loe,RN.

## 2016-07-05 NOTE — Care Management Note (Signed)
Case Management Note  Patient Details  Name: Beth Rollins MRN: [id number] Date of Birth: 1990-10-22  Subjective/Objective:                  26 y.o. female, No previous medical history, who is recently moved from [place] a few days ago, recently was at a refugee camp there is well visiting family. She was exposed to multiple mosquito bites. She presented to [facility] with chief complaints of fever which comes off and on every few hours associated with right years but no sweats, mostly headaches and mild nausea. In the ER blood work suggested severe thrombocytopenia, hypokalemia and hyponatremia. Case was discussed by the ER physician with ID physician Dr. Ilsa IhaSnyder and went them. Blood smear showed possible parasite per pathology technician, presumptive diagnosis of malaria was obtained and patient was [facility]  She currently has generalized headache, no body aches, no hematuria, no abdominal pain, no bruises. Besides above dictated review of systems other review of systems negative Action/Plan: Date:  Jul 05, 2016 Chart reviewed for concurrent status and case management needs. Will continue to follow patient progress. Discharge Planning: following for needs Expected discharge date: [id number] Marcelle SmilingRhonda Davis, BSN, [place]3, [place]   [phone]  Expected Discharge Date:                  Expected Discharge Plan:  Home/Self Care  In-House Referral:     Discharge planning Services  CM Consult  Post Acute Care Choice:    Choice offered to:     DME Arranged:    DME Agency:     HH Arranged:    HH Agency:     Status of Service:  In process, will continue to follow  If discussed at Long Length of Stay Meetings, dates discussed:    Additional Comments:  Golda AcreDavis, Rhonda Lynn, RN 07/05/2016, 8:52 AM

## 2016-07-05 NOTE — Progress Notes (Addendum)
PROGRESS NOTE [facility]   Avon Conger   ZOX:[id number] DOB: Mar 11, 1990  DOA: 07/03/2016 PCP: Patient, No Pcp Per   Brief Narrative:  26 year old female with no medical history who [place] [facility] which completed the fever which was on and off. Open easy evaluation or suggest severe thrombocytopenia and hypokalemia and hyponatremia. Blood smear showed possible parasite, so she was transferred to [facility] for presumptive malaria infection and further evaluation.   Subjective: Patient seen and examined, have no complaints today. Headaches has improved and she wants to go home. She reports her menstrual period has resolved. BP improved.  No acute events overnight.   Assessment & Plan: Falciparum malaria  Awaiting parasite count  ID following recommendations appreciated  On Coartem  Platelets improving - no transfusion needed at this time  Metabolic acidosis improved  LFT's normal  Will discuss with ID length of treatment and disposition   Headaches - patient has Hx of migraines - she reports pains is similar to migraine  Vicodin for tramadol PRN   Low TSH and free T4 - Hyperthyroidism ? illness related Asymptomatic  Will repeat Thyroid function test in 1-2 weeks as outpatient   Hypokalemia  Replacing  Monitor BMP   Anemia from acute blood loss - menstrual period  Initial Hgb 12.6 now 10, hemodilution and menses contributing  Continue to monitor   DVT prophylaxis: SCD Code Status: FULL Code  Family Communication: Husband at bedside  Disposition Plan: Home when medically stable, pending ID clearance   Consultants:   ID   Procedures:   None   Antimicrobials: Anti-infectives    Start     Dose/Rate Route Frequency Ordered Stop   07/04/16 0830  artemether-lumefantrine (COARTEM) 20-120 MG tablet 4 tablet     4 tablet Oral 2 times daily 07/03/16 1414 07/06/16 0759   07/03/16 1430  artemether-lumefantrine  (COARTEM) 20-120 MG tablet 4 tablet     4 tablet Oral Every 8 hours 07/03/16 1414 07/03/16 2334        Objective: Vitals:   07/05/16 0700 07/05/16 0800 07/05/16 0900 07/05/16 1000  BP: (!) 91/50 113/83 119/69 (!) 103/58  Pulse: 80 96 88 89  Resp: (!) 30 17 17  (!) 21  Temp:  98.6 F (37 C)    TempSrc:  Oral    SpO2: 99% 100% 99% 99%  Weight:      Height:        Intake/Output Summary (Last 24 hours) at 07/05/16 1106 Last data filed at 07/05/16 1000  Gross per 24 hour  Intake          1906.67 ml  Output             3500 ml  Net         -1593.33 ml   Filed Weights   07/04/16 0445 07/04/16 1456 07/05/16 0500  Weight: 55 kg (121 lb 4.1 oz) 51 kg (112 lb 7 oz) 54.6 kg (120 lb 5.9 oz)    Examination:  General exam: NAD  Respiratory system: Clear to auscultation. No wheezes,crackle or rhonchi Cardiovascular system: S1 & S2 heard, RRR. No JVD, murmurs, rubs or gallops Gastrointestinal system: Abdomen is nondistended, soft and nontender.  Central nervous system: Alert and oriented. No focal neurological deficits. Extremities: No pedal edema.  Skin: No rashes, lesions or ulcers Psychiatry: Judgement and insight appear normal. Mood & affect appropriate.    Data Reviewed: I have personally reviewed following labs and imaging studies  CBC:  Recent Labs Lab 07/03/16 0828 07/04/16 0625 07/04/16 1600 07/04/16 2328 07/05/16 0326  WBC 6.8 5.4 4.6 4.1 5.1  NEUTROABS 5.4  --   --   --   --   HGB 12.6 12.0 10.3* 9.9* 10.0*  HCT 35.4* 35.1* 30.1* 28.8* 28.1*  MCV 85.3 87.1 84.8 84.2 84.1  PLT 28* 40* 19* 22* 26*   Basic Metabolic Panel:  Recent Labs Lab 07/03/16 0828 07/04/16 0625 07/05/16 0326  NA 131* 135 137  K 2.8* 4.3 3.2*  CL 103 111 105  CO2 16* 16* 26  GLUCOSE 91 82 131*  BUN 12 10 <5*  CREATININE 0.54 0.45 0.41*  CALCIUM 8.1* 7.9* 7.4*   GFR: Estimated Creatinine Clearance: 87.4 mL/min (A) (by C-G formula based on SCr of 0.41 mg/dL (L)). Liver  Function Tests:  Recent Labs Lab 07/03/16 0828 07/04/16 1232 07/05/16 0326  AST 50* 41 41  ALT 42 34 33  ALKPHOS 82 65 62  BILITOT 1.8* 1.2 0.7  PROT 6.4* 5.7* 5.2*  ALBUMIN 3.1* 2.6* 2.1*   No results for input(s): LIPASE, AMYLASE in the last 168 hours. No results for input(s): AMMONIA in the last 168 hours. Coagulation Profile: No results for input(s): INR, PROTIME in the last 168 hours. Cardiac Enzymes: No results for input(s): CKTOTAL, CKMB, CKMBINDEX, TROPONINI in the last 168 hours. BNP (last 3 results) No results for input(s): PROBNP in the last 8760 hours. HbA1C: No results for input(s): HGBA1C in the last 72 hours. CBG:  Recent Labs Lab 07/04/16 1125 07/04/16 1723 07/04/16 2314 07/05/16 0628  GLUCAP 149* 203* 134* 119*   Lipid Profile: No results for input(s): CHOL, HDL, LDLCALC, TRIG, CHOLHDL, LDLDIRECT in the last 72 hours. Thyroid Function Tests:  Recent Labs  07/03/16 1225 07/04/16 1232  TSH 0.279*  --   FREET4  --  1.24*  T3FREE  --  2.4   Anemia Panel: No results for input(s): VITAMINB12, FOLATE, FERRITIN, TIBC, IRON, RETICCTPCT in the last 72 hours. Sepsis Labs:  Recent Labs Lab 07/03/16 0835 07/04/16 1232 07/04/16 1600  LATICACIDVEN 1.11 1.2 1.3    Recent Results (from the past 240 hour(s))  Culture, blood (routine x 2)     Status: None (Preliminary result)   Collection Time: 07/03/16  8:30 AM  Result Value Ref Range Status   Specimen Description BLOOD RIGHT ARM  Final   Special Requests   Final    BOTTLES DRAWN AEROBIC AND ANAEROBIC Blood Culture adequate volume   Culture   Final    NO GROWTH 1 DAY Performed at [facility], [address], [place], [place] [address]    Report Status PENDING  Incomplete  Culture, blood (routine x 2)     Status: None (Preliminary result)   Collection Time: 07/03/16  8:30 AM  Result Value Ref Range Status   Specimen Description BLOOD LEFT ARM  Final   Special Requests   Final    BOTTLES  DRAWN AEROBIC AND ANAEROBIC Blood Culture adequate volume   Culture   Final    NO GROWTH 1 DAY Performed at [facility], [address], [place], [place] [address]    Report Status PENDING  Incomplete  MRSA PCR Screening     Status: None   Collection Time: 07/04/16  2:56 PM  Result Value Ref Range Status   MRSA by PCR NEGATIVE NEGATIVE Final    Comment:        The GeneXpert MRSA Assay (  FDA approved for NASAL specimens only), is one component of a comprehensive MRSA colonization surveillance program. It is not intended to diagnose MRSA infection nor to guide or monitor treatment for MRSA infections.      Radiology Studies: No results found.    Scheduled Meds: . artemether-lumefantrine  4 tablet Oral BID   Continuous Infusions: .  sodium bicarbonate  infusion 1000 mL 100 mL/hr at 07/05/16 1000     LOS: 2 days   Latrelle Dodrill, MD Pager: Text Page via [email or web address]  [phone]  If 7PM-7AM, please contact night-coverage [email or web address] Password [email or web address] 07/05/2016, 11:06 AM

## 2016-07-05 NOTE — Progress Notes (Signed)
Subjective:   " I feel better"   Antibiotics:  Anti-infectives    Start     Dose/Rate Route Frequency Ordered Stop   07/04/16 0830  artemether-lumefantrine (COARTEM) 20-120 MG tablet 4 tablet     4 tablet Oral 2 times daily 07/03/16 1414 07/05/16 1717   07/03/16 1430  artemether-lumefantrine (COARTEM) 20-120 MG tablet 4 tablet     4 tablet Oral Every 8 hours 07/03/16 1414 07/03/16 2334      Medications: Scheduled Meds:  Continuous Infusions:  PRN Meds:.acetaminophen **OR** acetaminophen, albuterol, bisacodyl, HYDROcodone-acetaminophen, ondansetron **OR** ondansetron (ZOFRAN) IV    Objective: Weight change: 0 lb (0 kg)  Intake/Output Summary (Last 24 hours) at 07/05/16 1734 Last data filed at 07/05/16 1000  Gross per 24 hour  Intake          1291.67 ml  Output             3500 ml  Net         -2208.33 ml   Blood pressure 109/67, pulse 79, temperature 98.3 F (36.8 C), temperature source Oral, resp. rate 15, height 5\' 3"  (1.6 m), weight 120 lb 5.9 oz (54.6 kg), last menstrual period 07/02/2016, SpO2 99 %. Temp:  [98.3 F (36.8 C)-98.6 F (37 C)] 98.3 F (36.8 C) (05/30 1553) Pulse Rate:  [77-116] 79 (05/30 1600) Resp:  [15-39] 15 (05/30 1600) BP: (91-119)/(50-83) 109/67 (05/30 1600) SpO2:  [99 %-100 %] 99 % (05/30 1600) Weight:  [120 lb 5.9 oz (54.6 kg)] 120 lb 5.9 oz (54.6 kg) (05/30 0500)  Physical Exam: General: Alert and awake, oriented x3 smiling in NAD. HEENT: anicteric sclera, pupils reactive to light and accommodation, EOMI CVS tachy  regular rate,  Chest: no wheezing, resp distres Abdomen: soft , nondistended, normal bowel sounds, Neuro: nonfocal  CBC:  CBC Latest Ref Rng & Units 07/05/2016 07/04/2016 07/04/2016  WBC 4.0 - 10.5 K/uL 5.1 4.1 4.6  Hemoglobin 12.0 - 15.0 g/dL 10.0(L) 9.9(L) 10.3(L)  Hematocrit 36.0 - 46.0 % 28.1(L) 28.8(L) 30.1(L)  Platelets 150 - 400 K/uL 26(LL) 22(LL) 19(LL)      BMET  Recent Labs  07/04/16 0625  07/05/16 0326  NA 135 137  K 4.3 3.2*  CL 111 105  CO2 16* 26  GLUCOSE 82 131*  BUN 10 <5*  CREATININE 0.45 0.41*  CALCIUM 7.9* 7.4*     Liver Panel   Recent Labs  07/04/16 1232 07/05/16 0326  PROT 5.7* 5.2*  ALBUMIN 2.6* 2.1*  AST 41 41  ALT 34 33  ALKPHOS 65 62  BILITOT 1.2 0.7  BILIDIR 0.5  --   IBILI 0.7  --        Sedimentation Rate No results for input(s): ESRSEDRATE in the last 72 hours. C-Reactive Protein No results for input(s): CRP in the last 72 hours.  Micro Results: Recent Results (from the past 720 hour(s))  Culture, blood (routine x 2)     Status: None (Preliminary result)   Collection Time: 07/03/16  8:30 AM  Result Value Ref Range Status   Specimen Description BLOOD RIGHT ARM  Final   Special Requests   Final    BOTTLES DRAWN AEROBIC AND ANAEROBIC Blood Culture adequate volume   Culture   Final    NO GROWTH 2 DAYS Performed at [facility], [address] [place], [place] [address]    Report Status PENDING  Incomplete  Culture, blood (routine x 2)  Status: None (Preliminary result)   Collection Time: 07/03/16  8:30 AM  Result Value Ref Range Status   Specimen Description BLOOD LEFT ARM  Final   Special Requests   Final    BOTTLES DRAWN AEROBIC AND ANAEROBIC Blood Culture adequate volume   Culture   Final    NO GROWTH 2 DAYS Performed at [facility], [address] [place], [place] [address]    Report Status PENDING  Incomplete  MRSA PCR Screening     Status: None   Collection Time: 07/04/16  2:56 PM  Result Value Ref Range Status   MRSA by PCR NEGATIVE NEGATIVE Final    Comment:        The GeneXpert MRSA Assay (FDA approved for NASAL specimens only), is one component of a comprehensive MRSA colonization surveillance program. It is not intended to diagnose MRSA infection nor to guide or monitor treatment for MRSA infections.     Studies/Results: No results found.    Assessment/Plan:  INTERVAL  HISTORY: fever curve improved, k corrected, TTpenia improving   Principal Problem:   Malaria Active Problems:   Hypokalemia   Hyponatremia   Thrombocytopenia (HCC)   Falciparum malaria   Acute nonintractable headache   Edema   Non-intractable vomiting with nausea   Metabolic acidosis    Beth Rollins is a 26 y.o. female with  Falciparum malaria  #1 Falciparum malaria:   She seems clinically better and she has finished her 24 tablet regimen of Coartem  I am comfortable with her DC to home tonight or tomorrow  She should have a repeat CBC, it can be done in our clinic in a week. Next time she travels she should see Dr. Luciana Axeomer or Drue SecondSnider in our clinic  #2 Headache: resolved  #3 Menorrhagia: she says this is better  #4 TTpenia: still low platelets, should improve with time       LOS: 2 days   Acey LavCornelius Van Dam 07/05/2016, 5:34 PM

## 2016-07-06 DIAGNOSIS — B54 Unspecified malaria: Secondary | ICD-10-CM

## 2016-07-06 DIAGNOSIS — E872 Acidosis: Secondary | ICD-10-CM

## 2016-07-06 DIAGNOSIS — E876 Hypokalemia: Secondary | ICD-10-CM

## 2016-07-06 DIAGNOSIS — R946 Abnormal results of thyroid function studies: Secondary | ICD-10-CM

## 2016-07-06 DIAGNOSIS — B509 Plasmodium falciparum malaria, unspecified: Principal | ICD-10-CM

## 2016-07-06 DIAGNOSIS — D696 Thrombocytopenia, unspecified: Secondary | ICD-10-CM

## 2016-07-06 LAB — CBC WITH DIFFERENTIAL/PLATELET
Basophils Absolute: 0 10*3/uL (ref 0.0–0.1)
Basophils Relative: 1 %
EOS PCT: 4 %
Eosinophils Absolute: 0.2 10*3/uL (ref 0.0–0.7)
HCT: 29.6 % — ABNORMAL LOW (ref 36.0–46.0)
Hemoglobin: 10.1 g/dL — ABNORMAL LOW (ref 12.0–15.0)
LYMPHS PCT: 46 %
Lymphs Abs: 2.6 10*3/uL (ref 0.7–4.0)
MCH: 29.1 pg (ref 26.0–34.0)
MCHC: 34.1 g/dL (ref 30.0–36.0)
MCV: 85.3 fL (ref 78.0–100.0)
Monocytes Absolute: 0.4 10*3/uL (ref 0.1–1.0)
Monocytes Relative: 6 %
Neutro Abs: 2.4 10*3/uL (ref 1.7–7.7)
Neutrophils Relative %: 43 %
PLATELETS: 85 10*3/uL — AB (ref 150–400)
RBC: 3.47 MIL/uL — AB (ref 3.87–5.11)
RDW: 14.2 % (ref 11.5–15.5)
WBC: 5.7 10*3/uL (ref 4.0–10.5)

## 2016-07-06 LAB — GLUCOSE, CAPILLARY: GLUCOSE-CAPILLARY: 120 mg/dL — AB (ref 65–99)

## 2016-07-06 LAB — PARASITE EXAM, BLOOD: Parasite Exam, Blood: POSITIVE — AB

## 2016-07-06 NOTE — Care Management Note (Signed)
Case Management Note  Patient Details  Name: Beth Rollins MRN: [id number] Date of Birth: 07-03-1990  Subjective/Objective:25 y/o f admitted w/Malaria. From home. Spouse states they have health insurance-showed me the BCBS card-will tube to admitting-encourgaged to go admitting to provide insurance card-I will tube copy to admitting.  Provided w/pcp listing, [facility]. They have a pharmacy. Patient/spouse can make own appt to f/u within 1 week. No further CM needs.                  Action/Plan:d/c home.   Expected Discharge Date:  07/06/16               Expected Discharge Plan:  Home/Self Care  In-House Referral:     Discharge planning Services  CM Consult  Post Acute Care Choice:    Choice offered to:     DME Arranged:    DME Agency:     HH Arranged:    HH Agency:     Status of Service:  Completed, signed off  If discussed at [employer] Length of Stay Meetings, dates discussed:    Additional Comments:  Lanier ClamMahabir, Beth Gaubert, RN 07/06/2016, 9:13 AM

## 2016-07-06 NOTE — Progress Notes (Signed)
Noted BCBS has been added per admitting.

## 2016-07-06 NOTE — Discharge Summary (Signed)
Physician Discharge Summary  Beth Rollins  Beth Rollins  DOB: [birth year]  DOA: 07/03/2016 PCP: Patient, No Pcp Per  Admit date: 07/03/2016 Discharge date: 07/06/2016  Admitted From: Home  Disposition:  Home   Recommendations for Outpatient Follow-up:  1. Follow up with PCP in 1-2 weeks 2. Please obtain BMP/CBC in one week to monitor Hgb and Kidney function  3. Follow up with infectious disease doctor in 1-2 weeks  4. Repeat Thyroid function test in 6 weeks   Discharge Condition: Stable  CODE STATUS: FULL  Diet recommendation: Regular   Brief/Interim Summary: Beth Rollins is a 26 year old female with no medical history who [place], [place] to the [facility] [place] c/o the fever which was on and off. Upon ED evaluation labs suggest severe thrombocytopenia, hypokalemia and hyponatremia. Blood smear showed possible parasite, so she was transferred to [facility] for presumptive malaria infection and further evaluation. ID was consulted, malarial diagnosis was confirmed and patient was started on Coartem. On day 2 of hospital stay patient was found to be lethargic and was transferred to the SDU for closer monitoring, symptoms resolved on its own she was monitored for another 24 hrs with no recurrent issues. Patient completed her Coartem regimen. ID cleared for discharge   Subjective: Patient was seen and examined the day of discharge. Patient has no complaints. Patient remains afebrile and asymptomatic. Tolerating diet well and ambulating w/o issues   Discharge Diagnoses/Hospital Course:   Falciparum malaria - Parasite count showed 3.9% parasitemia l Patient completed Coartem regimen    Metabolic acidosis resolved  LFT's normal  Follow up with ID in 1-2 weeks   Thrombocytopenia - continues to improve  Due to infectious process  Platelets upon d/c 85 Repeat CBC in 1 week should improve with time   Headaches - patient has Hx of migraines -  she reports pain is similar to migraine - resolved  Tylenol or Ibuprofen PRN pain  She was treated with Vicodin during hospital stay   Abnormal TFT - TSH 0.279, Free T4 1.24 ? Hyperthyroidism vs illness related Since patient asymptomatic and she is in acute illness will recommend to repeat Ttyroid function test in 1-2 weeks as outpatient   Hypokalemia  Repleted  Check BMP in 1 week    Anemia from acute blood loss - menstrual period  Initial Hgb 12.6 now 10, hemodilution and menses contributing  Check CBC in 1 week   On the day of the discharge the patient's vitals were stable, and no other acute medical condition were reported by patient. Patient was felt safe to be discharge to home  Discharge Instructions  You were cared for by a hospitalist during your hospital stay. If you have any questions about your discharge medications or the care you received while you were in the hospital after you are discharged, you can call the unit and asked to speak with the hospitalist on call if the hospitalist that took care of you is not available. Once you are discharged, your primary care physician will handle any further medical issues. Please note that NO REFILLS for any discharge medications will be authorized once you are discharged, as it is imperative that you return to your primary care physician (or establish a relationship with a primary care physician if you do not have one) for your aftercare needs so that they can reassess your need for medications and monitor your lab values.  Discharge Instructions    Call MD for:  difficulty breathing, headache or visual disturbances    Complete by:  As directed    Call MD for:  extreme fatigue    Complete by:  As directed    Call MD for:  hives    Complete by:  As directed    Call MD for:  persistant dizziness or light-headedness    Complete by:  As directed    Call MD for:  persistant nausea and vomiting    Complete by:  As directed    Call MD  for:  redness, tenderness, or signs of infection (pain, swelling, redness, odor or green/yellow discharge around incision site)    Complete by:  As directed    Call MD for:  severe uncontrolled pain    Complete by:  As directed    Call MD for:  temperature >100.4    Complete by:  As directed    Diet - low sodium heart healthy    Complete by:  As directed    Increase activity slowly    Complete by:  As directed      Allergies as of 07/06/2016   No Known Allergies     Medication List    You have not been prescribed any medications.    Follow-up Information    Daiva Eves, Lisette Grinder, MD. Schedule an appointment as soon as possible for a visit in 2 week(s).   Specialty:  Infectious Diseases Why:  Hospital follow up Contact information: [address] [place] [address] [phone]        Primary Care Physician. Schedule an appointment as soon as possible for a visit.   Why:  call for hospital f/u visit within 1 week         No Known Allergies  Consultations:  Infectious Diseases - Dr Daiva Eves    Procedures/Studies: Dg Chest 2 View  Result Date: 07/03/2016 CLINICAL DATA:  Fifteen fever 5 days. EXAM: CHEST  2 VIEW COMPARISON:  None. FINDINGS: The heart size and mediastinal contours are within normal limits. Both lungs are clear. The visualized skeletal structures are unremarkable. IMPRESSION: No active cardiopulmonary disease. Electronically Signed   By: Elige Ko   On: 07/03/2016 08:30      Discharge Exam: Vitals:   07/05/16 2121 07/06/16 0544  BP: 99/65 107/70  Pulse: 74 78  Resp: 18 18  Temp: 98.5 F (36.9 C) 98.6 F (37 C)   Vitals:   07/05/16 2000 07/05/16 2012 07/05/16 2121 07/06/16 0544  BP: (!) 103/56  99/65 107/70  Pulse: 70  74 78  Resp: 17  18 18   Temp:  98 F (36.7 C) 98.5 F (36.9 C) 98.6 F (37 C)  TempSrc:  Oral Oral Oral  SpO2: 99%  100% 100%  Weight:   56.6 kg (124 lb 11.2 oz) 55.5 kg (122 lb 6.4 oz)  Height:   5\' 3"  (1.6  m)     General: Pt is alert, awake, not in acute distress Cardiovascular: RRR, S1/S2 +, no rubs, no gallops Respiratory: CTA bilaterally, no wheezing, no rhonchi Abdominal: Soft, NT, ND, bowel sounds + Extremities: no edema, no cyanosis   The results of significant diagnostics from this hospitalization (including imaging, microbiology, ancillary and laboratory) are listed below for reference.     Microbiology: Recent Results (from the past 240 hour(s))  Culture, blood (routine x 2)     Status: None (Preliminary result)   Collection Time: 07/03/16  8:30 AM  Result Value Ref Range Status  Specimen Description BLOOD RIGHT ARM  Final   Special Requests   Final    BOTTLES DRAWN AEROBIC AND ANAEROBIC Blood Culture adequate volume   Culture   Final    NO GROWTH 3 DAYS Performed at [facility], [address] [place], [place] [address]    Report Status PENDING  Incomplete  Culture, blood (routine x 2)     Status: None (Preliminary result)   Collection Time: 07/03/16  8:30 AM  Result Value Ref Range Status   Specimen Description BLOOD LEFT ARM  Final   Special Requests   Final    BOTTLES DRAWN AEROBIC AND ANAEROBIC Blood Culture adequate volume   Culture   Final    NO GROWTH 3 DAYS Performed at [facility], [address], [place], [place] [address]    Report Status PENDING  Incomplete  MRSA PCR Screening     Status: None   Collection Time: 07/04/16  2:56 PM  Result Value Ref Range Status   MRSA by PCR NEGATIVE NEGATIVE Final    Comment:        The GeneXpert MRSA Assay (FDA approved for NASAL specimens only), is one component of a comprehensive MRSA colonization surveillance program. It is not intended to diagnose MRSA infection nor to guide or monitor treatment for MRSA infections.      Labs: BNP (last 3 results) No results for input(s): BNP in the last 8760 hours. Basic Metabolic Panel:  Recent Labs Lab 07/03/16 0828 07/04/16 0625 07/05/16 0326   NA 131* 135 137  K 2.8* 4.3 3.2*  CL 103 111 105  CO2 16* 16* 26  GLUCOSE 91 82 131*  BUN 12 10 <5*  CREATININE 0.54 0.45 0.41*  CALCIUM 8.1* 7.9* 7.4*   Liver Function Tests:  Recent Labs Lab 07/03/16 0828 07/04/16 1232 07/05/16 0326  AST 50* 41 41  ALT 42 34 33  ALKPHOS 82 65 62  BILITOT 1.8* 1.2 0.7  PROT 6.4* 5.7* 5.2*  ALBUMIN 3.1* 2.6* 2.1*   No results for input(s): LIPASE, AMYLASE in the last 168 hours. No results for input(s): AMMONIA in the last 168 hours. CBC:  Recent Labs Lab 07/03/16 0828 07/04/16 0625 07/04/16 1600 07/04/16 2328 07/05/16 0326 07/06/16 0453  WBC 6.8 5.4 4.6 4.1 5.1 5.7  NEUTROABS 5.4  --   --   --   --  2.4  HGB 12.6 12.0 10.3* 9.9* 10.0* 10.1*  HCT 35.4* 35.1* 30.1* 28.8* 28.1* 29.6*  MCV 85.3 87.1 84.8 84.2 84.1 85.3  PLT 28* 40* 19* 22* 26* 85*   Cardiac Enzymes: No results for input(s): CKTOTAL, CKMB, CKMBINDEX, TROPONINI in the last 168 hours. BNP: Invalid input(s): POCBNP CBG:  Recent Labs Lab 07/05/16 0628 07/05/16 1134 07/05/16 1718 07/05/16 2356 07/06/16 0542  GLUCAP 119* 118* 82 126* 120*   D-Dimer No results for input(s): DDIMER in the last 72 hours. Hgb A1c No results for input(s): HGBA1C in the last 72 hours. Lipid Profile No results for input(s): CHOL, HDL, LDLCALC, TRIG, CHOLHDL, LDLDIRECT in the last 72 hours. Thyroid function studies  Recent Labs  07/04/16 1232  T3FREE 2.4   Anemia work up No results for input(s): VITAMINB12, FOLATE, FERRITIN, TIBC, IRON, RETICCTPCT in the last 72 hours. Urinalysis    Component Value Date/Time   COLORURINE YELLOW 07/03/2016 1032   APPEARANCEUR CLEAR 07/03/2016 1032   LABSPEC 1.005 07/03/2016 1032   PHURINE 5.5 07/03/2016 1032   GLUCOSEU NEGATIVE 07/03/2016 1032  HGBUR LARGE (A) 07/03/2016 1032   BILIRUBINUR NEGATIVE 07/03/2016 1032   KETONESUR >80 (A) 07/03/2016 1032   PROTEINUR 30 (A) 07/03/2016 1032   NITRITE NEGATIVE 07/03/2016 1032   LEUKOCYTESUR  NEGATIVE 07/03/2016 1032   Sepsis Labs Invalid input(s): PROCALCITONIN,  WBC,  LACTICIDVEN Microbiology Recent Results (from the past 240 hour(s))  Culture, blood (routine x 2)     Status: None (Preliminary result)   Collection Time: 07/03/16  8:30 AM  Result Value Ref Range Status   Specimen Description BLOOD RIGHT ARM  Final   Special Requests   Final    BOTTLES DRAWN AEROBIC AND ANAEROBIC Blood Culture adequate volume   Culture   Final    NO GROWTH 3 DAYS Performed at [facility], [address], [place], [place] [address]    Report Status PENDING  Incomplete  Culture, blood (routine x 2)     Status: None (Preliminary result)   Collection Time: 07/03/16  8:30 AM  Result Value Ref Range Status   Specimen Description BLOOD LEFT ARM  Final   Special Requests   Final    BOTTLES DRAWN AEROBIC AND ANAEROBIC Blood Culture adequate volume   Culture   Final    NO GROWTH 3 DAYS Performed at [facility], [address] [place], [place] [address]    Report Status PENDING  Incomplete  MRSA PCR Screening     Status: None   Collection Time: 07/04/16  2:56 PM  Result Value Ref Range Status   MRSA by PCR NEGATIVE NEGATIVE Final    Comment:        The GeneXpert MRSA Assay (FDA approved for NASAL specimens only), is one component of a comprehensive MRSA colonization surveillance program. It is not intended to diagnose MRSA infection nor to guide or monitor treatment for MRSA infections.      Time coordinating discharge: 35 minutes  SIGNED:  Latrelle Dodrill, MD  [facility] 07/06/2016, 7:05 PM  Pager please text page via  [email or web address] Password [email or web address]

## 2016-07-08 LAB — CULTURE, BLOOD (ROUTINE X 2)
Culture: NO GROWTH
Culture: NO GROWTH
SPECIAL REQUESTS: ADEQUATE
Special Requests: ADEQUATE

## 2018-07-02 ENCOUNTER — Ambulatory Visit: Payer: BLUE CROSS/BLUE SHIELD | Admitting: Physician Assistant

## 2018-07-02 ENCOUNTER — Other Ambulatory Visit (HOSPITAL_COMMUNITY)
Admission: RE | Admit: 2018-07-02 | Discharge: 2018-07-02 | Disposition: A | Discharge status: Home / Self Care | Payer: No Typology Code available for payment source | Source: Ambulatory Visit | Attending: Physician Assistant | Admitting: Physician Assistant

## 2018-07-02 ENCOUNTER — Encounter: Payer: Self-pay | Admitting: Physician Assistant

## 2018-07-02 ENCOUNTER — Other Ambulatory Visit: Payer: Self-pay

## 2018-07-02 VITALS — BP 101/60 | HR 108 | Temp 98.1°F | Ht 63.0 in | Wt 131.0 lb

## 2018-07-02 DIAGNOSIS — R1031 Right lower quadrant pain: Secondary | ICD-10-CM

## 2018-07-02 LAB — POCT URINALYSIS DIPSTICK
Bilirubin, UA: NEGATIVE
Blood, UA: NEGATIVE
Glucose, UA: NEGATIVE
Ketones, UA: NEGATIVE
Leukocytes, UA: NEGATIVE
Nitrite, UA: NEGATIVE
Protein, UA: NEGATIVE
Spec Grav, UA: 1.02 (ref 1.010–1.025)
Urobilinogen, UA: 0.2 E.U./dL
pH, UA: 6 (ref 5.0–8.0)

## 2018-07-02 LAB — POCT URINE PREGNANCY: Preg Test, Ur: NEGATIVE

## 2018-07-02 MED ORDER — DOXYCYCLINE HYCLATE 100 MG PO TABS: 100.0000 mg | ORAL_TABLET | Freq: Two times a day (BID) | ORAL | 0 refills | Status: AC

## 2018-07-02 NOTE — Patient Instructions (Signed)
You will be contacted about your CT scan tomorrow.  If you have any worsening pain overnight, go to the ER!  Start the oral antibiotic (doxycyline) that I have sent in for you.  Jarold Motto PA-C

## 2018-07-02 NOTE — Progress Notes (Signed)
Beth Rollins is a 28 y.o. female here for a follow up of a pre-existing problem.  I acted as a Neurosurgeon for [employer], PA-C [employer], LPN  History of Present Illness:   Chief Complaint  Patient presents with  . right lower qudrant pain    HPI   RLQ pain Patient reports RLQ pain x 10 days, that sometimes radiates up into abdomen when sleeping. Also having pain with intercourse. Denies fever, chills, back pain, diarrhea, vaginal discharge. She is having constipation. She has felt this way before but now it is the "worse its ever been." She states that her pain was so severe that she had difficulty walking when the pain was severe, but it has slightly improved since then. Endorses good appetite. No significant weight changes.  Patient's last menstrual period was 06/25/2018 (exact date).   Past Medical History:  Diagnosis Date  . Falciparum malaria   . Migraine   . Thrombocytopenia (HCC)      Social History   Socioeconomic History  . Marital status: Married    Spouse name: Not on file  . Number of children: Not on file  . Years of education: Not on file  . Highest education level: Not on file  Occupational History  . Not on file  Social Needs  . Financial resource strain: Not on file  . Food insecurity:    Worry: Not on file    Inability: Not on file  . Transportation needs:    Medical: Not on file    Non-medical: Not on file  Tobacco Use  . Smoking status: Never Smoker  . Smokeless tobacco: Never Used  Substance and Sexual Activity  . Alcohol use: No  . Drug use: No  . Sexual activity: Not on file  Lifestyle  . Physical activity:    Days per week: Not on file    Minutes per session: Not on file  . Stress: Not on file  Relationships  . Social connections:    Talks on phone: Not on file    Gets together: Not on file    Attends religious service: Not on file    Active member of club or organization: Not on file    Attends meetings of clubs or  organizations: Not on file    Relationship status: Not on file  . Intimate partner violence:    Fear of current or ex partner: Not on file    Emotionally abused: Not on file    Physically abused: Not on file    Forced sexual activity: Not on file  Other Topics Concern  . Not on file  Social History Narrative  . Not on file    No past surgical history on file.  Family History  Problem Relation Age of Onset  . Polycystic kidney disease Father   . Polycystic kidney disease Sister   . Polycystic kidney disease Brother     No Known Allergies  Current Medications:   Current Outpatient Medications:  .  doxycycline (VIBRA-TABS) 100 MG tablet, Take 1 tablet (100 mg total) by mouth 2 (two) times daily for 14 days., Disp: 28 tablet, Rfl: 0   Review of Systems:   Review of Systems  Constitutional: Negative for chills, fever, malaise/fatigue and weight loss.  Respiratory: Negative for shortness of breath.   Cardiovascular: Negative for chest pain, orthopnea, claudication and leg swelling.  Gastrointestinal: Positive for abdominal pain and constipation. Negative for heartburn, nausea and vomiting.  Neurological: Negative for dizziness, tingling  and headaches.    Vitals:   Vitals:   07/02/18 1431  BP: 101/60  Pulse: (!) 108  Temp: 98.1 F (36.7 C)  TempSrc: Oral  SpO2: 97%  Weight: 131 lb (59.4 kg)  Height: 5\' 3"  (1.6 m)     Body mass index is 23.21 kg/m.  Physical Exam:   Physical Exam Vitals signs and nursing note reviewed. Exam conducted with a chaperone present.  Constitutional:      General: She is not in acute distress.    Appearance: She is well-developed. She is not ill-appearing or toxic-appearing.  Cardiovascular:     Rate and Rhythm: Normal rate and regular rhythm.     Pulses: Normal pulses.     Heart sounds: Normal heart sounds, S1 normal and S2 normal.     Comments: No LE edema Pulmonary:     Effort: Pulmonary effort is normal.     Breath sounds:  Normal breath sounds.  Abdominal:     General: Abdomen is flat. Bowel sounds are normal.     Palpations: Abdomen is soft.     Tenderness: There is abdominal tenderness in the right lower quadrant and epigastric area. There is no right CVA tenderness, left CVA tenderness, guarding or rebound. Negative signs include McBurney's sign.  Genitourinary:    Pubic Area: No rash.      Labia:        Right: No rash, tenderness or lesion.        Left: No rash, tenderness or lesion.      Vagina: Normal. No tenderness.     Cervix: Cervical motion tenderness (moderate) present. No friability or erythema.     Uterus: Normal.      Adnexa:        Right: Tenderness present.        Left: No tenderness.    Skin:    General: Skin is warm and dry.  Neurological:     Mental Status: She is alert.     GCS: GCS eye subscore is 4. GCS verbal subscore is 5. GCS motor subscore is 6.  Psychiatric:        Speech: Speech normal.        Behavior: Behavior normal. Behavior is cooperative.     Results for orders placed or performed in visit on 07/02/18  POCT urinalysis dipstick  Result Value Ref Range   Color, UA yellow    Clarity, UA clear    Glucose, UA Negative Negative   Bilirubin, UA N    Ketones, UA N    Spec Grav, UA 1.020 1.010 - 1.025   Blood, UA N    pH, UA 6.0 5.0 - 8.0   Protein, UA Negative Negative   Urobilinogen, UA 0.2 0.2 or 1.0 E.U./dL   Nitrite, UA N    Leukocytes, UA Negative Negative   Appearance     Odor    POCT urine pregnancy  Result Value Ref Range   Preg Test, Ur Negative Negative    Assessment and Plan:   Beth Rollins was seen today for right lower qudrant pain.  Diagnoses and all orders for this visit:  RLQ abdominal pain UA negative, urine pregnancy negative. Differential includes: constipation, ovarian cyst, muscle strain, appendicitis, PID or other etiology. I did recommend that we do a rocephin injection given cervical motion tenderness, and start doxycycline. She  declined rocephin injection despite my educational efforts, and verbalized understanding of risks of not having the injection. I also encouraged her to take  the oral doxycycline and she states that she will consider it. Will check labs and order stat CT scan. ER precautions advised for any worsening symptoms.  -     POCT urinalysis dipstick -     POCT urine pregnancy -     Cervicovaginal ancillary only -     CBC with Differential/Platelet -     Comprehensive metabolic panel -     TSH -     doxycycline (VIBRA-TABS) 100 MG tablet; Take 1 tablet (100 mg total) by mouth 2 (two) times daily for 14 days. -     CT Abdomen Pelvis W Contrast; Future  . Reviewed expectations re: course of current medical issues. . Discussed self-management of symptoms. . Outlined signs and symptoms indicating need for more acute intervention. . Patient verbalized understanding and all questions were answered. . See orders for this visit as documented in the electronic medical record. . Patient received an After-Visit Summary.  CMA or LPN served as scribe during this visit. History, Physical, and Plan performed by medical provider. The above documentation has been reviewed and is accurate and complete.  I spent 45 minutes with this patient, greater than 50% was face-to-face time counseling regarding the above diagnoses.  Jarold MottoSamantha Suhas Estis, PA-C

## 2018-07-03 ENCOUNTER — Ambulatory Visit
Admission: RE | Admit: 2018-07-03 | Discharge: 2018-07-03 | Disposition: A | Discharge status: Home / Self Care | Payer: No Typology Code available for payment source | Source: Ambulatory Visit | Attending: Physician Assistant | Admitting: Physician Assistant

## 2018-07-03 ENCOUNTER — Telehealth: Payer: Self-pay | Admitting: Physician Assistant

## 2018-07-03 ENCOUNTER — Other Ambulatory Visit: Payer: Self-pay | Admitting: Physician Assistant

## 2018-07-03 DIAGNOSIS — R1031 Right lower quadrant pain: Secondary | ICD-10-CM

## 2018-07-03 DIAGNOSIS — R9389 Abnormal findings on diagnostic imaging of other specified body structures: Secondary | ICD-10-CM

## 2018-07-03 LAB — COMPREHENSIVE METABOLIC PANEL
ALT: 12 U/L (ref 0–35)
AST: 17 U/L (ref 0–37)
Albumin: 4.2 g/dL (ref 3.5–5.2)
Alkaline Phosphatase: 64 U/L (ref 39–117)
BUN: 8 mg/dL (ref 6–23)
CO2: 26 mEq/L (ref 19–32)
Calcium: 9.5 mg/dL (ref 8.4–10.5)
Chloride: 104 mEq/L (ref 96–112)
Creatinine, Ser: 0.56 mg/dL (ref 0.40–1.20)
GFR: 153.89 mL/min (ref >60.00)
Glucose, Bld: 116 mg/dL — ABNORMAL HIGH (ref 70–99)
Potassium: 4.5 mEq/L (ref 3.5–5.1)
Sodium: 137 mEq/L (ref 135–145)
Total Bilirubin: 0.2 mg/dL (ref 0.2–1.2)
Total Protein: 7.5 g/dL (ref 6.0–8.3)

## 2018-07-03 LAB — CBC WITH DIFFERENTIAL/PLATELET
Basophils Absolute: 0.1 10*3/uL (ref 0.0–0.1)
Basophils Relative: 0.6 % (ref 0.0–3.0)
Eosinophils Absolute: 0.3 10*3/uL (ref 0.0–0.7)
Eosinophils Relative: 3.4 % (ref 0.0–5.0)
HCT: 38.1 % (ref 36.0–46.0)
Hemoglobin: 13.1 g/dL (ref 12.0–15.0)
Lymphocytes Relative: 20.1 % (ref 12.0–46.0)
Lymphs Abs: 1.8 10*3/uL (ref 0.7–4.0)
MCHC: 34.3 g/dL (ref 30.0–36.0)
MCV: 88.7 fl (ref 78.0–100.0)
Monocytes Absolute: 0.7 10*3/uL (ref 0.1–1.0)
Monocytes Relative: 7.3 % (ref 3.0–12.0)
Neutro Abs: 6.3 10*3/uL (ref 1.4–7.7)
Neutrophils Relative %: 68.6 % (ref 43.0–77.0)
Platelets: 330 10*3/uL (ref 150.0–400.0)
RBC: 4.29 Mil/uL (ref 3.87–5.11)
RDW: 13.3 % (ref 11.5–15.5)
WBC: 9.1 10*3/uL (ref 4.0–10.5)

## 2018-07-03 LAB — TSH: TSH: 1.27 u[IU]/mL (ref 0.35–4.50)

## 2018-07-03 MED ORDER — IOPAMIDOL (ISOVUE-300) INJECTION 61%
100.0000 mL | Freq: Once | INTRAVENOUS | Status: AC | PRN
  Administered 2018-07-03: 100 mL via INTRAVENOUS

## 2018-07-03 NOTE — Telephone Encounter (Signed)
Samantha notified. 

## 2018-07-03 NOTE — Telephone Encounter (Signed)
Beth Rollins looked into scheduling the patient for a STAT referral and advised that she contacted [facility] to have the patient walk in to have imaging done today. She did check with [facility] however, they were full and it was more expensive for the patient.  I contacted the patient and provided the address and number for [facility] at [address] and advised nothing to drink or eat 6 hours prior to. The patient confirmed she has had nothing to drink or eat in the past 6 hours and would head over to [facility].   No further action required.

## 2018-07-04 LAB — CERVICOVAGINAL ANCILLARY ONLY
Bacterial vaginitis: NEGATIVE
Candida vaginitis: NEGATIVE
Chlamydia: NEGATIVE
Neisseria Gonorrhea: NEGATIVE
Trichomonas: NEGATIVE

## 2018-07-07 IMAGING — CR DG CHEST 2V
2 series · 2 of 2 positions shown · non-contrast
Comparison: None.

CLINICAL DATA: Fifteen fever 5 days.

EXAM:
CHEST  2 VIEW

[w chest pa]
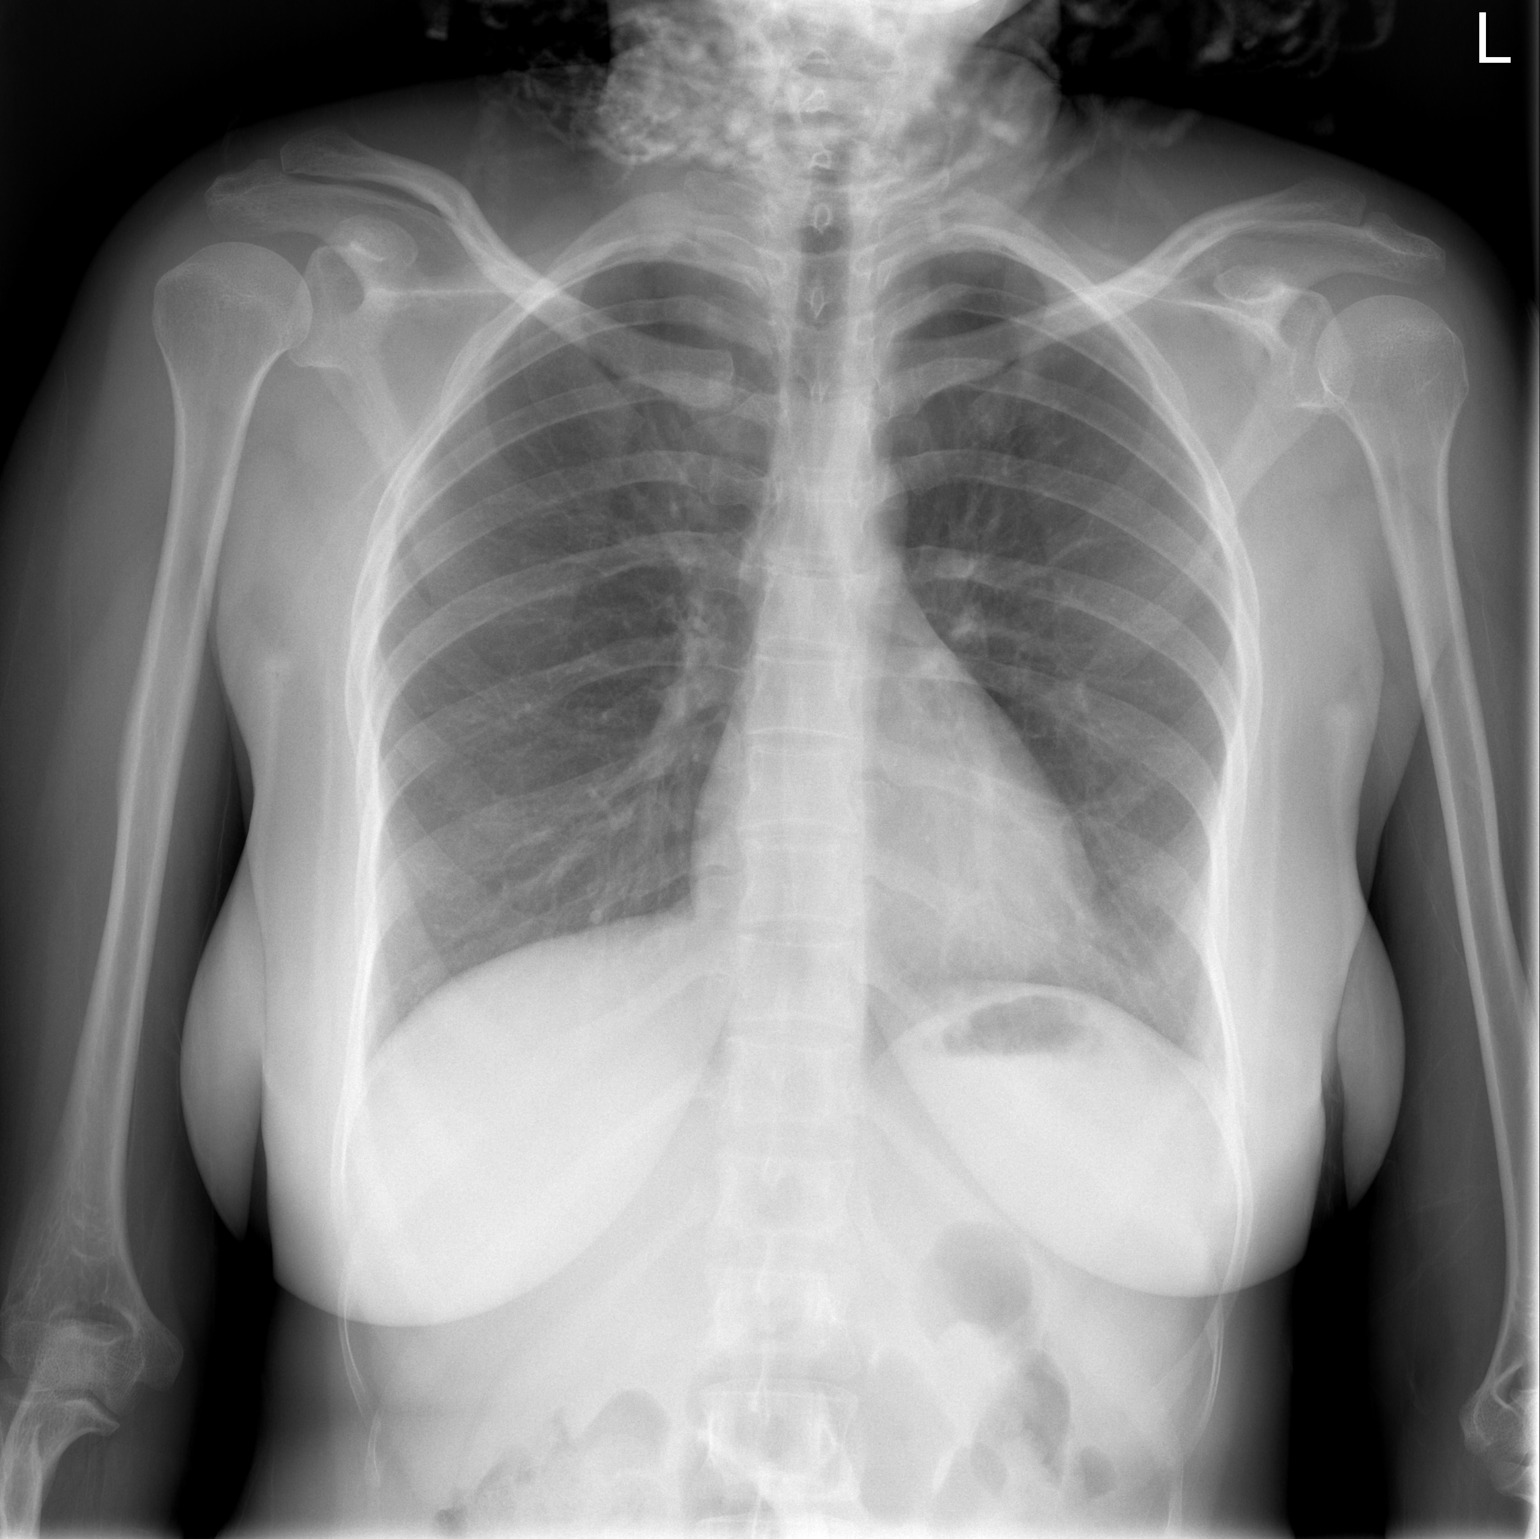

[w chest lat]
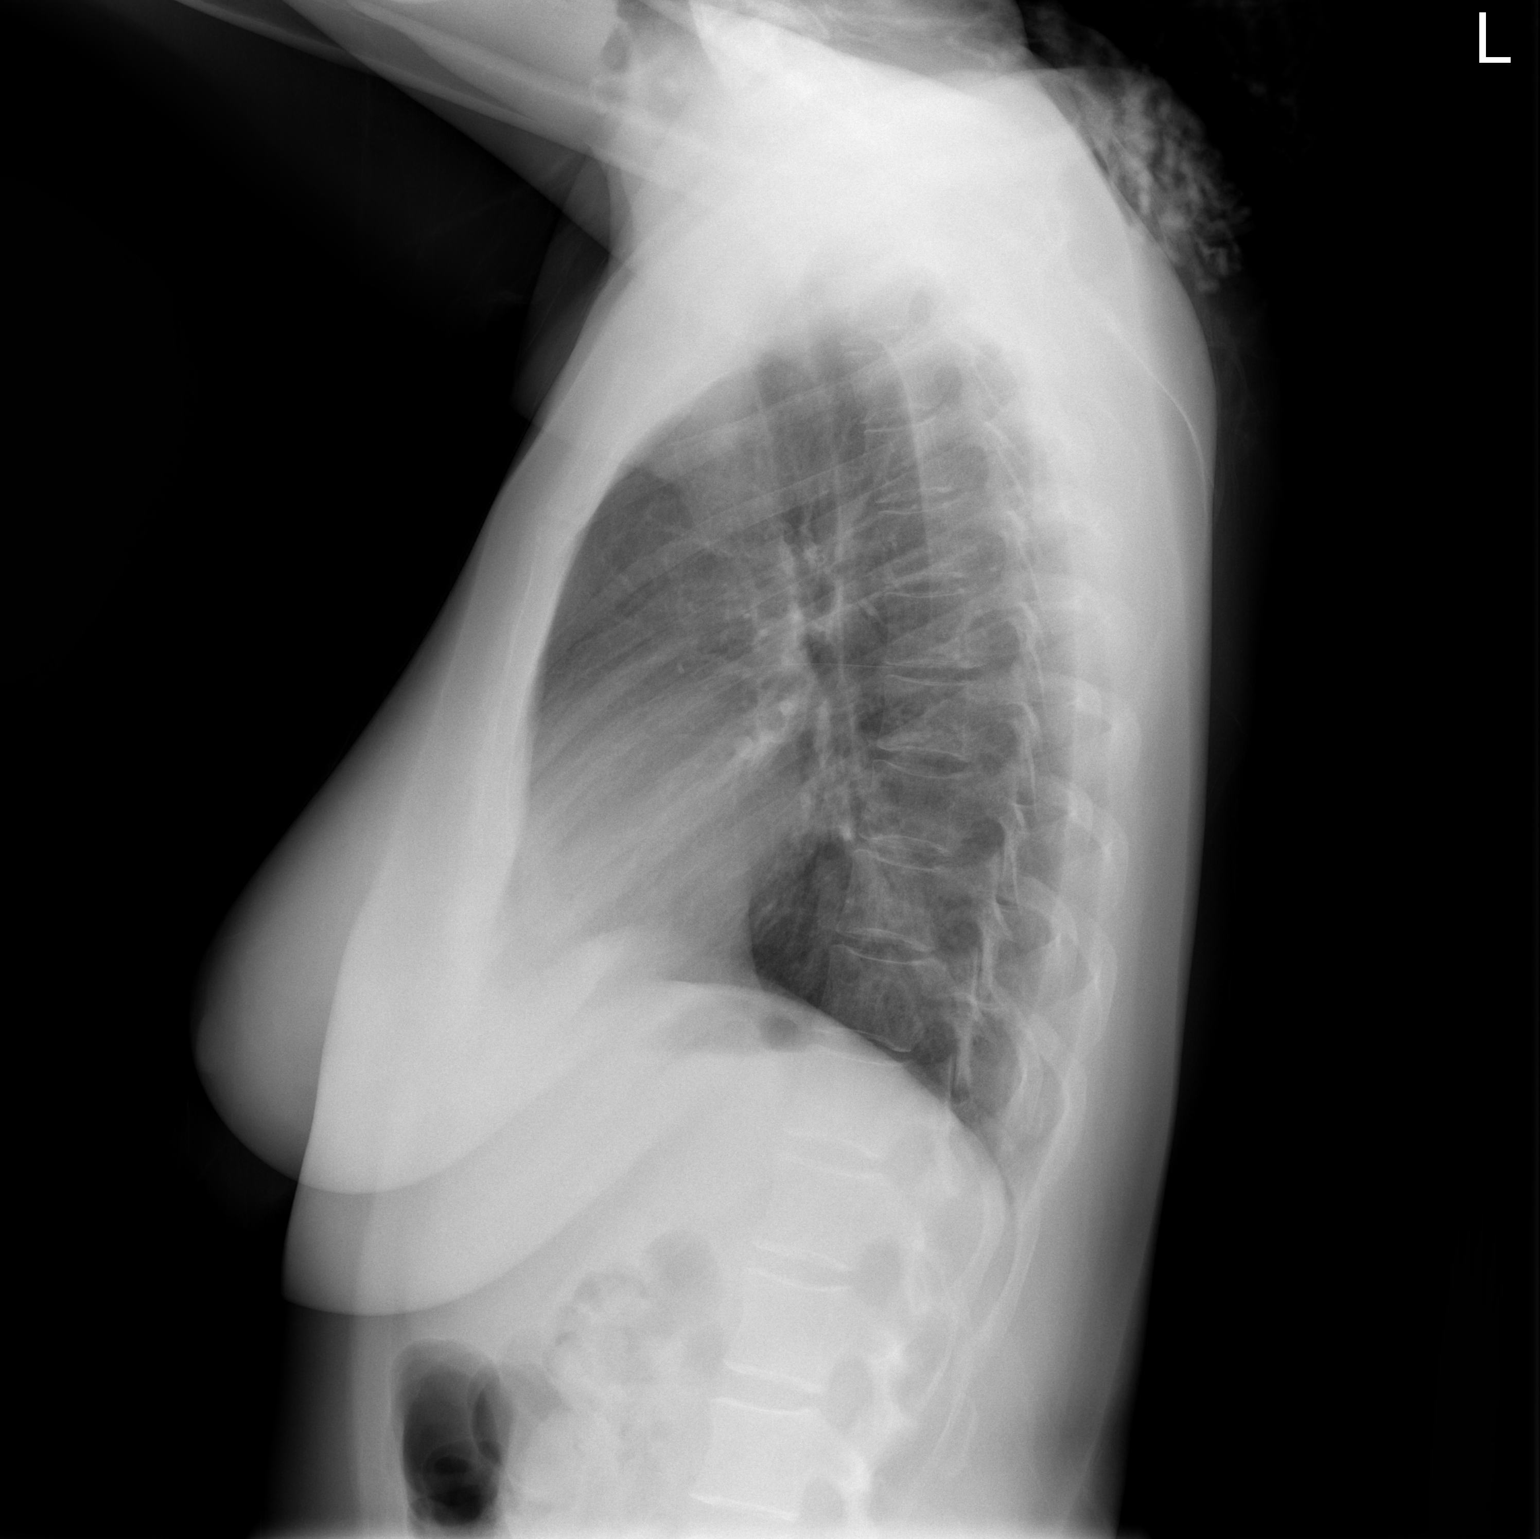

[2 of 2 positions shown; findings below may reference images not displayed]

FINDINGS: The heart size and mediastinal contours are within normal limits.
Both lungs are clear. The visualized skeletal structures are
unremarkable.
IMPRESSION: No active cardiopulmonary disease.

## 2020-12-15 ENCOUNTER — Ambulatory Visit: Payer: Self-pay

## 2020-12-15 LAB — POCT PREGNANCY, URINE: Preg Test, Ur: POSITIVE — AB

## 2020-12-16 ENCOUNTER — Ambulatory Visit: Payer: Self-pay

## 2020-12-16 ENCOUNTER — Ambulatory Visit (INDEPENDENT_AMBULATORY_CARE_PROVIDER_SITE_OTHER): Payer: Self-pay | Admitting: Obstetrics & Gynecology

## 2020-12-16 ENCOUNTER — Other Ambulatory Visit: Payer: Self-pay

## 2020-12-16 VITALS — BP 121/82 | HR 91 | Wt 119.5 lb

## 2020-12-16 DIAGNOSIS — Z3201 Encounter for pregnancy test, result positive: Secondary | ICD-10-CM

## 2020-12-16 DIAGNOSIS — Z3200 Encounter for pregnancy test, result unknown: Secondary | ICD-10-CM

## 2020-12-16 NOTE — Progress Notes (Signed)
  History:  [name] is a 30 y.o. G1P0 who presents to clinic today with complaint of possible pregnancy.    Past Medical History:  Diagnosis Date   Falciparum malaria    Migraine    Thrombocytopenia (HCC)     No past surgical history on file.  The following portions of the patient's history were reviewed and updated as appropriate: allergies, current medications, past family history, past medical history, past social history, past surgical history and problem list.   Review of Systems:  Pertinent items noted in HPI and remainder of comprehensive ROS otherwise negative.  Objective:  Physical Exam BP 121/82   Pulse 91   Wt 119 lb 8 oz (54.2 kg)   LMP 10/07/2020   BMI 21.17 kg/m  Physical Exam   Labs and Imaging Results for orders placed or performed in visit on 12/15/20 (from the past 24 hour(s))  Pregnancy, urine POC     Status: Abnormal   Collection Time: 12/15/20  3:05 PM  Result Value Ref Range   Preg Test, Ur POSITIVE (A) NEGATIVE    No results found.   Assessment & Plan:  1. Possible pregnancy Pregnancy test positive, [redacted]w[redacted]d  Patient will schedule prenatal care  Prenatal vitamins    Approximately 15 minutes of total time was spent with this patient on history taking, coordination of care, education and documentation.   Adam Phenix, MD  12/16/2020 12:09 PM

## 2020-12-16 NOTE — Progress Notes (Signed)
Possible Pregnancy  Here today for pregnancy confirmation. UPT in office today is positive. Pt reports first positive home UPT about 2 weeks prior. Reviewed dating with patient:   LMP: 10/07/20 EDD: 07/14/21 10w today  Patient reports intermittent pain on right side of abdomen. This has been present for past 3 years. Reports this usually happens following intercourse. OB history reviewed. This is patient's first confirmed pregnancy; reports possible undocumented miscarriage about 2 years ago. Reviewed medications and allergies with patient; list of medications safe to take during pregnancy given.  Recommended pt begin prenatal vitamin and schedule prenatal care. Patient would like to receive care in our office. States that she plans to remain in [place] until 6.5 months of pregnancy. At that time she will return to [place] to be with family during delivery. Pt plans to return to the [place] once she is able to travel.   Unable to doppler FHR today. Reviewed with Debroah Loop, MD who states this is not concerning at 10w. Recommends pt follow up for routine prenatal care. Debroah Loop, MD to bedside for brief visit with patient. Patient will return in 1 week for new OB intake.  Marjo Bicker, RN 12/16/2020  10:35 AM

## 2020-12-16 NOTE — Patient Instructions (Signed)

## 2020-12-23 ENCOUNTER — Other Ambulatory Visit: Payer: Self-pay

## 2020-12-23 ENCOUNTER — Ambulatory Visit (INDEPENDENT_AMBULATORY_CARE_PROVIDER_SITE_OTHER): Payer: Self-pay

## 2020-12-23 DIAGNOSIS — Z3400 Encounter for supervision of normal first pregnancy, unspecified trimester: Secondary | ICD-10-CM | POA: Insufficient documentation

## 2020-12-23 DIAGNOSIS — Z3480 Encounter for supervision of other normal pregnancy, unspecified trimester: Secondary | ICD-10-CM

## 2020-12-23 NOTE — Progress Notes (Signed)
New OB Intake  I connected with  Beth Rollins on 12/23/20 at 11:15 AM EST by MyChart Video Visit and verified that I am speaking with the correct person using two identifiers. Nurse is located at [facility] and pt is located at [employer].  I discussed the limitations, risks, security and privacy concerns of performing an evaluation and management service by telephone and the availability of in person appointments. I also discussed with the patient that there may be a patient responsible charge related to this service. The patient expressed understanding and agreed to proceed.  I explained I am completing New OB Intake today. We discussed her EDD of 08/13/21 that is based on LMP of 11/06/20. Pt is G1/P0. I reviewed her allergies, medications, Medical/Surgical/OB history, and appropriate screenings. I informed her of [facility] services. Based on history, this is a/an  pregnancy uncomplicated .   Patient Active Problem List   Diagnosis Date Noted   Menorrhagia with regular cycle    Thrombocytopenia (HCC)    Falciparum malaria     Concerns addressed today  Delivery Plans:  Plans to deliver at [facility].   MyChart/Babyscripts MyChart access verified. I explained pt will have some visits in office and some virtually. Babyscripts instructions given and order placed. Patient verifies receipt of registration text/e-mail. Account successfully created and app downloaded.  Blood Pressure Cuff  Patient is self-pay; explained patient will be given BP cuff at first prenatal appt. Explained after first prenatal appt pt will check weekly and document in Babyscripts.  Weight scale: Patient    have weight scale. Weight scale ordered for patient to pick up form [facility].   Anatomy US Explained first scheduled [place] will be around 19 weeks. Anatomy US scheduled for 03/15/20 at 9:45a. Pt notified to arrive at 9:30a.  Labs Discussed Avelina Laine genetic screening with patient. Would like both Panorama and Horizon  drawn at new OB visit. Routine prenatal labs needed.  Covid Vaccine Patient has covid vaccine.   Centering in Pregnancy Candidate?  If yes, offer as possibility  Mother/ Baby Dyad Candidate?    If yes, offer as possibility  Informed patient of [facility] website  and placed link in her AVS.   Social Determinants of Health Food Insecurity: Patient denies food insecurity. WIC Referral: Patient is interested in referral to [facility].  Transportation: Patient denies transportation needs. Childcare: Discussed no children allowed at ultrasound appointments. Offered childcare services; patient declines childcare services at this time.  Send link to Pregnancy Navigators   Placed OB Box on problem list and updated  First visit review I reviewed new OB appt with pt. I explained she will have a pelvic exam, ob bloodwork with genetic screening, and PAP smear. Explained pt will be seen by Dr.Eckstat at first visit; encounter routed to appropriate provider. Explained that patient will be seen by pregnancy navigator following visit with provider. [facility] information placed in AVS.   Henrietta Dine, CMA 12/23/2020  11:18 AM

## 2020-12-23 NOTE — Patient Instructions (Signed)
AREA PEDIATRIC/FAMILY PRACTICE PHYSICIANS  Central/[place]outheast [place] ([address]) [facility] Chambliss, MD; Eniola, MD; Hale, MD; Hensel, MD; McDiarmid, MD; McIntyer, MD; Correen Bubolz, MD; Walden, MD [address] [place], [place] [address] [phone] Mon-Fri 8:30-12:30, 1:30-5:00 Providers come to see babies at [facility] Accepting Medicaid [facility] providers who accept newborns: Koirala, MD; Morrow, MD; Wolters, MD [address], [place], [place] [address] [phone] Mon-Fri 8:00-5:30 Babies seen by providers at [facility] Does NOT accept Medicaid Please call early in hospitalization for appointment (limited availability)  [facility] Mulberry, MD [address] [place], [place] [address] [phone] Mon, Tue, Thur, Fri 8:30-5:00, Wed 10:00-7:00 (closed 1-2pm) Babies seen by [facility] providers Accepting Medicaid Rubin - Pediatrician Rubin, MD [address], [place], [place] [address] [phone] Mon-Fri 8:30-5:00, Sat 8:30-12:00 Provider comes to see babies at [facility] Accepting Medicaid Must have been referred from current patients or contacted office prior to delivery [facility] ([facility]) Brown, MD; Chandler, MD; Ettefagh, MD; Grant, MD; Lester, MD; McCormick, MD; McQueen, MD; Prose, MD; Simha, MD; Stanley, MD; Stryffeler, NP; Tebben, NP [address], [place], [place] [address] [phone] Mon, Tue, Thur, Fri 8:30-5:30, Wed 9:30-5:30, Sat 8:30-12:30 Babies seen by [facility] providers Accepting Medicaid Only accepting infants of first-time parents or siblings of current patients Hospital discharge coordinator will make follow-up appointment Jack Amos [address], [place], [place]  [address] [phone]   Fax - [phone] [facility] [address], [place], [place]  [address] Phone - [phone]   Fax - [phone] Shilpa Gosrani [address], [place], [place]  [address] [phone]  [place] ([address]) [facility] Bates, MD; Brassfield, MD; Cooper, Cox, MD; MD; Davis, MD; Dovico, MD; Ettefaugh, MD; Little, MD; Lowe, MD; Keiffer, MD; Melvin, MD; Sumner, MD; Williams, MD [address], [place], [place] [address] [phone] Mon-Fri 8:30-5:00 (extended evenings Mon-Thur as needed), Sat-Sun 10:00-1:00 Providers come to see babies at [facility] Accepting Medicaid for families of first-time babies and families with all children in the household age 3 and under. Must register with office prior to making appointment (M-F only). [facility] Henson, NP; Knapp, MD; Lalonde, MD; Tysinger, PA [address] [place], [place] [address] [phone] Mon-Fri 8:00-5:00 Babies seen by providers at [facility] Does NOT accept Medicaid/Commercial Insurance Only [facility] ([facility])  Artis, MD; Barnes, MD; Bratton, MD; Coccaro, MD; Lockett Gardner, MD; Kramer, MD; [place], MD; Netherton, MD; Poleto, MD; Skinner, MD [address] [place], [place] [address] [phone] Mon-Fri 8:30-5:30, Sat (Oct.-Mar.) 9:00-1:00 Babies seen by providers at [facility] Accepting Medicaid  [place] ([address]) [facility] Reid, MD; Warner, MD [address], [place], [place] [address] [phone] Mon-Fri 8:30-5:00, Sat 8:30-12:00 Providers come to see babies at [facility] Does NOT accept Medicaid [facility] Becker, PA; Hagler, MD; Scifres, PA; Sun, MD; Swayne, MD [address], ,  [address] [phone] Mon-Fri 8:00-5:00 Babies seen by providers at [facility] Does NOT accept Medicaid Only accepting babies of parents who  are patients Please call early in hospitalization for appointment (limited availability)  Pediatricians Clark, MD; Frye, MD; Kelleher, MD; Mack, NP; Miller, MD; O'Keller, MD; Patterson, NP; Pudlo, MD; Puzio, MD; Thomas, MD; Tucker, MD; Twiselton, MD [address], [place], [place] [address] [phone] Mon-Fri 8:00-5:00, Sat 9:00-12:00 Providers come to see babies at [facility] Does NOT accept Medicaid  [place] ([address]) [facility] providers accepting new patients: Brake, NP; Wharton, PA [address], [place], [place] [address] [phone] Mon-Fri 8:00-5:00 Babies seen by providers at [facility] Does NOT accept Medicaid Only accepting babies of parents who are patients Please call early in hospitalization for appointment (limited availability) [facility] Gay, MD; Quinlan, MD [address] [place], [place] [address] [phone] (press 1 to schedule appointment) Mon-Fri 8:00-5:00 Providers come to see babies at [facility] Does NOT accept Medicaid [facility] Mazer, MD [address] [place], [place] [address] [phone] Mon-Fri 8:30-5:00 (lunch 12:30-1:00), extended hours by appointment only Wed 5:00-6:30 Babies seen by [facility] providers Accepting Medicaid [facility] Banks, MD; Jordan, MD; Koberlein, MD [address], [place], [place] [address] [phone] Mon-Fri 8:00-5:00 Babies seen by [facility] providers Does NOT accept Medicaid [facility] Parker, MD; Hunter, MD; Wallace, DO [address] [place], [place] [address] [phone] Mon-Fri 8:00-5:00 Babies seen by [facility] providers Does NOT accept Medicaid [facility] Brandon, PA; Brecken, PA; Christy, NP; Dees, MD; DeClaire, MD; DeWeese, MD; Hansen, NP; Mills, NP; Parrish, NP; Smoot, NP; Summer, MD; Vapne,  MD [address] [place], [place] [address] [phone] Mon-Fri 8:30-5:00, Sat 10:00-1:00 Providers come to see babies at [facility] Does NOT accept Medicaid Free prenatal information session Tuesdays at 4:45pm [facility] Bouska, MD; Gordon, PA; Jeffery, PA; Weber, PA [address] [place] [address] [phone] Mon-Fri 7:30-5:30 Babies seen by [facility] providers [facility] [address], [place], [place]  [address] [phone]   Fax - [phone]  [place] ([address] & [address]) [facility] [place] [address] [place], [place] [address] [phone] Mon-Thur 8:00-6:00 Providers come to see babies at [facility] Accepting Medicaid [facility] Anderson, NP; Badger, MD; Beal, PA; Spencer, PA [address] [place], [place] [address] [phone] Mon-Thur 7:30-7:30, Fri 7:30-4:30 Babies seen by [facility] providers Accepting Medicaid [facility] Agbuya, MD; Klett, NP; Romgoolam, MD [address], [place], [place] [address] [phone] Mon-Fri 8:30-5:00, Sat 8:30-12:00 Providers come to see babies at [facility] Accepting Medicaid Must have "Meet & Greet" appointment at office prior to delivery [facility]) McCord, MD; Wallace, MD; Wood, MD [address], [place], [place] [address] [phone] Mon-Wed 8:00-6:00, Thur-Fri 8:00-5:00, Sat 9:00-12:00 Providers come to see babies at [facility] Does NOT accept Medicaid Only accepting siblings of current patients [facility]  [address], [place], [place]  [address] [phone]   Fax - [phone] [facility] [address], [place], [place]  [address] [phone]   Fax -  [phone]  [place] ([address] & [address])  [facility] Cirigliano, DO; Matthews, DO [address] [place], [place] [address] [phone] Mon-Fri 7:00-5:00 Babies seen by [facility] providers Does NOT accept Medicaid [facility] Briscoe, MD; Howley, PA; Moreira, PA [address], [place],  [address] [phone] Mon-Fri 8:00-5:00 Babies seen by [facility] providers Accepting Medicaid [facility] Boyd, MD; Church, PA; Jones, NP; Osborn, PA [address], ,  [address] [phone] Mon-Fri 8:00-5:00 Babies seen by providers at [facility] Accepting Medicaid  [place] ([address]) [facility] [place] Wendling, DO [address] [place], [place] [address] [phone] Mon-Fri 8:00-5:00 Babies seen by [facility] providers Does NOT accept Medicaid Limited availability, please call early in hospitalization to schedule follow-up [facility] Calderon, PA; Cummings, MD; Dillard, MD; Martin, PA; Olson, MD; VanDeven, PA [address], [place], [place] [address] [phone] Mon-Fri 8:30-5:00, Sat 9:00-12:00 Babies seen by providers at [facility] Accepting Medicaid Please register online then schedule online or call office [email or web address] Hunter, NP; Kumar, MD; [place] Rogers, PA [address], [place], [place] [address] [phone] Mon-Fri 8:00-5:00 Babies seen by providers at [facility] Accepting Medicaid [facility] ([facility]) W[place], MD; Kristi Fleenor, NP; West, MD [address], [place], [place] [address] [phone] Mon-Fri 8:00-5:30, Sat&Sun by appointment (phones open at  8:30) Babies seen by [facility] providers Accepting Medicaid Must be a first-time baby or sibling of current patient [facility] [place]  [address], [place], [place]  [address] [phone]   Fax - [phone]  [place] ([address]) [facility] Brown, PA; Cowen, PA; Rice, MD; Helton, PA; Spry, MD [address] [place], [place] [address] [phone] Mon-Thur 8:00-7:00, Fri 8:00-5:00, Sat 8:00-12:00, Sun 9:00-12:00 Babies seen by [facility] providers Accepting Medicaid [facility] Coe-Goins, MD; [place], MD; Pierre-Louis, MD [address], [place], [place] [address] [phone] Mon-Thur 8:00-5:00 Babies seen by providers at [facility] Accepting Medicaid [facility] Bratton, MD; Coe-Goins, MD; Hayes, MD; Lewis, MD; List, MD; Lott, MD; [place], MD; Moran, MD; O'Kamera Dubas, MD; Pierre-Louis, MD; Pitonzo, MD; Scholer, MD; Spangle, MD [address] [place], [place] [address] [phone] Mon-Fri 8:00-5:30, Sat (Oct.-Mar.) 9:00-1:00 Babies seen by providers at [facility] Accepting Medicaid Must fill out new patient packet, available online at [email or web address] [facility] ([facility]) Friddle, NP; Harris, NP; Kelly, NP; Logan, MD; Melvin, PA; Poth, MD; Ramadoss, MD; [place], NP [address], [place], [place] [address] [phone] Mon-Thur 8:00-5:30, Fri 8:00-5:00 Babies seen by providers at [facility] Accepting Medicaid  [place] ([address]) [facility] Dixon, PA; [place], MD; Pickard, MD; Tapia, PA [address], [place], [place] [address] [phone] Mon-Fri 8:00-5:00 Babies seen by providers at [facility] Accepting Medicaid   [place] ([address]) [facility] [place] Masneri, DO; Meyers, MD; Nelson, PA [address], [place], [place] [address] [phone] Mon-Fri 8:00-5:00 Babies seen by providers at [facility] Does NOT accept Medicaid Limited appointment availability, please call early in hospitalization  [facility] [place] Kunedd, DO; McGowen, MD [address], [place], [place] [address] [phone] Mon-Fri 8:00-5:00 Babies seen by [facility] providers Does NOT accept Medicaid [facility] [place] Cameron, MD; MacDonald, MD; Michaels, PA; Nayak, MD [address] [place] [address], [place], [place] [address] [phone] Mon-Fri 8:00-5:00 After hours clinic ([address] [place],  [address]) [phone] Mon-Fri 5:00-8:00, Sat 12:00-6:00, Sun 10:00-4:00 Babies seen by [facility] providers Accepting Medicaid [facility] [place] [address], [place], [place]  [address] [phone]   Fax - [phone]  [place] ([address]) [facility] [place] [facility] Andy, MD [address], [place], [place] [address] [phone] Mon-Fri 8:00-5:00 Babies seen by [facility] providers Does NOT accept Medicaid [facility] [place] ([facility] [place]) Eksir, MD [address], [place], [place] [address] [phone] Mon-Thur 8:00-7:00, Fri 8:00-5:00, Sat 8:00-12:00 Babies seen by providers at [facility] Accepting Medicaid - but does not have vaccinations in office (must be received elsewhere) Limited availability, please call early in hospitalization  [place] ([address]) [facility]  Charlene Flemming, MD [address], [place] [address] [phone]  Fax [phone]  [facility]  Kimberly Newton, MD, Annamarie Streilein, PA, Carla Hampton, PA [address] [place], [place]  [address] [phone] [facility]  [address], [place], [place] [address] [phone] [address], [place], [place] [address] [phone] (West Office)  [facility] [address], [place], [place] [address] [phone] [facility] [address], [place], [place] [address] [phone] [facility] [address], [place], [place] [address] [phone] [facility] [address], [place], [place] [address] [phone] [facility] [address], [place], [place] [address] [phone] [facility] [address], [place], [place] [address] [phone] [facility]  [address], [place], [place] [address] [phone] Dr[facility] [address], [place], [place] [address] [phone] [facility] [address], [place], [place] [address] [phone] [facility] [address], [place],  [address] [phone]  

## 2021-01-10 ENCOUNTER — Encounter: Payer: Self-pay | Admitting: Obstetrics and Gynecology

## 2021-01-24 ENCOUNTER — Other Ambulatory Visit (HOSPITAL_COMMUNITY)
Admission: RE | Admit: 2021-01-24 | Discharge: 2021-01-24 | Disposition: A | Discharge status: Home / Self Care | Payer: BLUE CROSS/BLUE SHIELD | Source: Ambulatory Visit | Attending: Obstetrics and Gynecology | Admitting: Obstetrics and Gynecology

## 2021-01-24 ENCOUNTER — Other Ambulatory Visit: Payer: Self-pay

## 2021-01-24 ENCOUNTER — Ambulatory Visit (INDEPENDENT_AMBULATORY_CARE_PROVIDER_SITE_OTHER): Payer: Self-pay | Admitting: Family Medicine

## 2021-01-24 ENCOUNTER — Encounter: Payer: Self-pay | Admitting: *Deleted

## 2021-01-24 VITALS — BP 135/95 | HR 101 | Ht 61.0 in | Wt 126.3 lb

## 2021-01-24 DIAGNOSIS — Z8271 Family history of polycystic kidney: Secondary | ICD-10-CM

## 2021-01-24 DIAGNOSIS — Z3400 Encounter for supervision of normal first pregnancy, unspecified trimester: Secondary | ICD-10-CM

## 2021-01-24 DIAGNOSIS — Z3480 Encounter for supervision of other normal pregnancy, unspecified trimester: Secondary | ICD-10-CM | POA: Diagnosis present

## 2021-01-24 DIAGNOSIS — Z3A11 11 weeks gestation of pregnancy: Secondary | ICD-10-CM

## 2021-01-24 MED ORDER — ASPIRIN EC 81 MG PO TBEC: 81.0000 mg | DELAYED_RELEASE_TABLET | Freq: Every day | ORAL | 2 refills | Status: AC

## 2021-01-24 NOTE — Progress Notes (Signed)
INITIAL PRENATAL VISIT- Mom+Baby Combined Care  Subjective:   Beth Rollins is being seen today for her first obstetrical visit.  She is at [redacted]w[redacted]d gestation by LMP Her obstetrical history is significant for  family hx of polycystic kidney . FOB is not involved in pregnancy. Patient does intend to breast feed. Pregnancy history fully reviewed.  Patient reports no complaints.  Indications for ASA therapy (per uptodate) One of the following: Previous pregnancy with preeclampsia, especially early onset and with an adverse outcome No Multifetal gestation No Chronic hypertension No Type 1 or 2 diabetes mellitus No Chronic kidney disease No Autoimmune disease (antiphospholipid syndrome, systemic lupus erythematosus) No  Two or more of the following: Nulliparity Yes Obesity (body mass index >30 kg/m2) No Family history of preeclampsia in mother or sister No Age ?35 years No Sociodemographic characteristics (African American race, low socioeconomic level) Yes Personal risk factors (eg, previous pregnancy with low birth weight or small for gestational age infant, previous adverse pregnancy outcome [eg, stillbirth], interval >10 years between pregnancies) No  Indications for early GDM screening  First-degree relative with diabetes No BMI >30kg/m2 No Age > 25 Yes Previous birth of an infant weighing ?4000 g No Gestational diabetes mellitus in a previous pregnancy No Glycated hemoglobin ?5.7 percent (39 mmol/mol), impaired glucose tolerance, or impaired fasting glucose on previous testing No High-risk race/ethnicity (eg, African American, Latino, Native American, Asian American, Pacific Islander) Yes Previous stillbirth of unknown cause No Maternal birthweight > 9 lbs No History of cardiovascular disease No Hypertension or on therapy for hypertension No High-density lipoprotein cholesterol level <35 mg/dL (0.90 mmol/L) and/or a triglyceride level >250 mg/dL (2.82 mmol/L) No Polycystic  ovary syndrome No Physical inactivity No Other clinical condition associated with insulin resistance (eg, severe obesity, acanthosis nigricans) No Current use of glucocorticoids No   Early screening tests: FBS, A1C, Random CBG, glucose challenge   Review of Systems:   Review of Systems  Objective:    Obstetric History OB History  Gravida Para Term Preterm AB Living  1            SAB IAB Ectopic Multiple Live Births               # Outcome Date GA Lbr Len/2nd Weight Sex Delivery Anes PTL Lv  1 Current             Past Medical History:  Diagnosis Date   Falciparum malaria    Migraine    Thrombocytopenia (HCC)     Past Surgical History:  Procedure Laterality Date   NO PAST SURGERIES      Current Outpatient Medications on File Prior to Visit  Medication Sig Dispense Refill   Prenatal Vit-Fe Fumarate-FA (MULTIVITAMIN-PRENATAL) 27-0.8 MG TABS tablet Take 1 tablet by mouth daily at 12 noon.     No current facility-administered medications on file prior to visit.    No Known Allergies  Social History:  reports that she has never smoked. She has never used smokeless tobacco. She reports that she does not drink alcohol and does not use drugs.  Family History  Problem Relation Age of Onset   Polycystic kidney disease Father    Polycystic kidney disease Sister    Polycystic kidney disease Brother     The following portions of the patient's history were reviewed and updated as appropriate: allergies, current medications, past family history, past medical history, past social history, past surgical history and problem list.  Review of Systems Review of Systems  Constitutional:  Negative for chills and fever.  HENT:  Negative for congestion and sore throat.   Eyes:  Negative for pain and visual disturbance.  Respiratory:  Negative for cough, chest tightness and shortness of breath.   Cardiovascular:  Negative for chest pain.  Gastrointestinal:  Negative for abdominal  pain, diarrhea, nausea and vomiting.  Endocrine: Negative for cold intolerance and heat intolerance.  Genitourinary:  Negative for dysuria and flank pain.  Musculoskeletal:  Negative for back pain.  Skin:  Negative for rash.  Allergic/Immunologic: Negative for food allergies.  Neurological:  Negative for dizziness and light-headedness.  Psychiatric/Behavioral:  Negative for agitation.      Physical Exam:  BP (!) 135/95    Pulse (!) 101    Ht 5\' 1"  (1.549 m)    Wt 126 lb 4.8 oz (57.3 kg)    LMP 11/06/2020    BMI 23.86 kg/m   Fetal Status: Fetal Heart Rate (bpm): 175   Movement: Absent     CONSTITUTIONAL: Well-developed, well-nourished female in no acute distress.  HENT:  Normocephalic, atraumatic, External right and left ear normal. Oropharynx is clear and moist EYES: Conjunctivae normal. No scleral icterus.  NECK: Normal range of motion, supple, no masses.  Normal thyroid.  SKIN: Skin is warm and dry. No rash noted. Not diaphoretic. No erythema. No pallor. MUSCULOSKELETAL: Normal range of motion. No tenderness.  No cyanosis, clubbing, or edema.   NEUROLOGIC: Alert and oriented to person, place, and time. Normal muscle tone coordination.  PSYCHIATRIC: Normal mood and affect. Normal behavior. Normal judgment and thought content. CARDIOVASCULAR: Normal heart rate noted, regular rhythm RESPIRATORY: Clear to auscultation bilaterally. Effort and breath sounds normal, no problems with respiration noted. BREASTS: Symmetric in size. No masses, skin changes, nipple drainage, or lymphadenopathy. ABDOMEN: Soft, normal bowel sounds, no distention noted.  No tenderness, rebound or guarding.  PELVIC: not indicated    Assessment:    Pregnancy: G1P0  1. Supervision of normal first pregnancy, antepartum - GC/Chlamydia probe amp ([place])not at [facility] - CBC/D/Plt+RPR+Rh+ABO+RubIgG... - Culture, OB Urine - Hemoglobin A1c  3. Family history of adult polycystic kidney disease - multiple family  members with disease, sister just died from disease     Plan:     Initial labs drawn. Prenatal vitamins. Problem list reviewed and updated. Reviewed in detail the nature of the practice with collaborative care  Discussed Mom+Baby model with prenatal care and then infant will received care at our office. Reviewed providers involved in care.  Genetic screening discussed: NIPS/AFP requested. Role of ultrasound in pregnancy discussed; [facility]: requested. Amniocentesis discussed: not indicated. Follow up in 4 weeks. Discussed clinic routines, schedule of care and testing, genetic screening options, involvement of students and residents under the direct supervision of APPs and doctors and presence of female providers. Pt verbalized understanding.   [place], MD 01/24/2021 10:11 AM

## 2021-01-24 NOTE — Progress Notes (Signed)
Possible palpitations?  Family hx of polycyctic kidney disease. Multiple family members have died.

## 2021-01-25 LAB — CBC/D/PLT+RPR+RH+ABO+RUBIGG...
Antibody Screen: NEGATIVE
Basophils Absolute: 0 10*3/uL (ref 0.0–0.2)
Basos: 0 %
EOS (ABSOLUTE): 0.7 10*3/uL — ABNORMAL HIGH (ref 0.0–0.4)
Eos: 7 %
HCV Ab: <0.1 s/co ratio (ref 0.0–0.9)
HIV Screen 4th Generation wRfx: NONREACTIVE
Hematocrit: 35.1 % (ref 34.0–46.6)
Hemoglobin: 12.4 g/dL (ref 11.1–15.9)
Hepatitis B Surface Ag: NEGATIVE
Immature Grans (Abs): 0 10*3/uL (ref 0.0–0.1)
Immature Granulocytes: 0 %
Lymphocytes Absolute: 2.7 10*3/uL (ref 0.7–3.1)
Lymphs: 25 %
MCH: 30.5 pg (ref 26.6–33.0)
MCHC: 35.3 g/dL (ref 31.5–35.7)
MCV: 86 fL (ref 79–97)
Monocytes Absolute: 0.7 10*3/uL (ref 0.1–0.9)
Monocytes: 7 %
Neutrophils Absolute: 6.4 10*3/uL (ref 1.4–7.0)
Neutrophils: 61 %
Platelets: 305 10*3/uL (ref 150–450)
RBC: 4.07 x10E6/uL (ref 3.77–5.28)
RDW: 13.5 % (ref 11.7–15.4)
RPR Ser Ql: NONREACTIVE
Rh Factor: POSITIVE
Rubella Antibodies, IGG: 1.41 index (ref >0.99)
WBC: 10.6 10*3/uL (ref 3.4–10.8)

## 2021-01-25 LAB — HEMOGLOBIN A1C
Est. average glucose Bld gHb Est-mCnc: 114 mg/dL
Hgb A1c MFr Bld: 5.6 % (ref 4.8–5.6)

## 2021-01-25 LAB — GC/CHLAMYDIA PROBE AMP (~~LOC~~) NOT AT ARMC
Chlamydia: NEGATIVE
Comment: NEGATIVE
Comment: NORMAL
Neisseria Gonorrhea: NEGATIVE

## 2021-01-25 LAB — HCV INTERPRETATION

## 2021-01-26 LAB — CULTURE, OB URINE

## 2021-01-26 LAB — URINE CULTURE, OB REFLEX: Organism ID, Bacteria: NO GROWTH

## 2021-02-16 ENCOUNTER — Telehealth: Payer: Self-pay | Admitting: *Deleted

## 2021-02-16 NOTE — Telephone Encounter (Signed)
I called Loan to welcome her to [facility] but reached her voicemail. I left a message I am calling to discuss her appointment and will send a detailed Mychart message. Imanol Bihl,RN

## 2021-02-23 ENCOUNTER — Ambulatory Visit (INDEPENDENT_AMBULATORY_CARE_PROVIDER_SITE_OTHER): Payer: Self-pay | Admitting: Family Medicine

## 2021-02-23 ENCOUNTER — Other Ambulatory Visit: Payer: Self-pay

## 2021-02-23 VITALS — BP 104/61 | Wt 127.0 lb

## 2021-02-23 DIAGNOSIS — Z3A16 16 weeks gestation of pregnancy: Secondary | ICD-10-CM

## 2021-02-23 DIAGNOSIS — Z3400 Encounter for supervision of normal first pregnancy, unspecified trimester: Secondary | ICD-10-CM

## 2021-02-23 DIAGNOSIS — B509 Plasmodium falciparum malaria, unspecified: Secondary | ICD-10-CM

## 2021-02-23 DIAGNOSIS — Z8271 Family history of polycystic kidney: Secondary | ICD-10-CM

## 2021-02-25 ENCOUNTER — Encounter: Payer: Self-pay | Admitting: *Deleted

## 2021-02-28 ENCOUNTER — Encounter: Payer: Self-pay | Admitting: Family Medicine

## 2021-02-28 NOTE — Progress Notes (Signed)
° ° ° °  PRENATAL VISIT NOTE- [facility]regnancy Cycle 1, Session # 1  Subjective:  Beth Rollins is a 31 y.o. G1P0 at [redacted]w[redacted]d being seen today for ongoing prenatal care through [facility].  She is currently monitored for the following issues for this low-risk pregnancy and has Falciparum malaria; Menorrhagia with regular cycle; Supervision of normal first pregnancy, antepartum; and Family history of adult polycystic kidney disease on their problem list.  Patient reports nausea and dehydration .  Contractions: Not present. Vag. Bleeding: None.  Movement: Absent. Denies leaking of fluid/ROM.   The following portions of the patient's history were reviewed and updated as appropriate: allergies, current medications, past family history, past medical history, past social history, past surgical history and problem list. Problem list updated.  Objective:   Vitals:   02/23/21 1733  BP: 104/61  Weight: 127 lb (57.6 kg)    Fetal Status: Fetal Heart Rate (bpm): 155   Movement: Absent     General:  Alert, oriented and cooperative. Patient is in no acute distress.  Skin: Skin is warm and dry. No rash noted.   Cardiovascular: Normal heart rate noted  Respiratory: Normal respiratory effort, no problems with respiration noted  Abdomen: Soft, gravid, appropriate for gestational age.  Pain/Pressure: Absent     Pelvic: Cervical exam deferred        Extremities: Normal range of motion.  Edema: None  Mental Status: Normal mood and affect. Normal behavior. Normal judgment and thought content.   Assessment and Plan:  Pregnancy: G1P0 at [redacted]w[redacted]d  1. Falciparum malaria  2. Supervision of normal first pregnancy, antepartum Desired resources for hydration-- wrote down pedialyte and pediasure  Centering Pregnancy, Session#1: Introduction to model of care. Group determined rules for self-governance and closing phrase. Oriented group to space and mother's notebook.   Facilitated discussion today:   common discomforts, When to call practice  Mindfulness activity completed as well as introduction to deep breathing for childbirth preparation- Centering 3 Breaths  Fundal height and FHR appropriate today unless noted otherwise in plan. Patient to continue group care.   Given general work note  3. Family history of adult polycystic kidney disease    Preterm labor symptoms and general obstetric precautions including but not limited to vaginal bleeding, contractions, leaking of fluid and fetal movement were reviewed in detail with the patient. Please refer to After Visit Summary for other counseling recommendations.   Return in about 4 weeks (around 03/23/2021) for Centering.  Future Appointments  Date Time Provider [place]  03/15/2021  9:45 AM [facility] US4 [facility]  03/23/2021  2:00 PM CENTERING PROVIDER [facility]  04/20/2021  2:00 PM CENTERING PROVIDER [facility]  05/18/2021  2:00 PM CENTERING PROVIDER [facility]  06/01/2021  2:00 PM CENTERING PROVIDER [facility]  06/15/2021  2:00 PM CENTERING PROVIDER [facility]  06/29/2021  2:00 PM CENTERING PROVIDER [facility]  07/13/2021  2:00 PM CENTERING PROVIDER [facility]  07/27/2021  2:00 PM CENTERING PROVIDER [facility]  08/10/2021  2:00 PM CENTERING PROVIDER [facility]    Caren Macadam, MD

## 2021-03-09 ENCOUNTER — Other Ambulatory Visit: Payer: Self-pay | Admitting: Family Medicine

## 2021-03-09 DIAGNOSIS — Z3480 Encounter for supervision of other normal pregnancy, unspecified trimester: Secondary | ICD-10-CM

## 2021-03-15 ENCOUNTER — Ambulatory Visit: Payer: Self-pay | Attending: Family Medicine

## 2021-03-21 ENCOUNTER — Encounter: Payer: Self-pay | Admitting: *Deleted

## 2021-03-23 ENCOUNTER — Encounter: Payer: Self-pay | Admitting: Family Medicine

## 2021-03-24 ENCOUNTER — Telehealth: Payer: Self-pay | Admitting: *Deleted

## 2021-03-24 ENCOUNTER — Encounter: Payer: Self-pay | Admitting: *Deleted

## 2021-03-24 NOTE — Telephone Encounter (Signed)
Beth Rollins [place] appointment and [facility] prenatal care appointment yesterday. I called her and left a message notifying her she missed her prenatal care appointment yesterday and that I was calling to get that rescheduled, also her [place], and also her afp. Needs to be offered financial assistance packet if she has not gotten that yet, she is self pay. Will send Mychart message . Will send message to front desk also to try to reschedule.  Onya Eutsler,RN

## 2021-03-28 ENCOUNTER — Telehealth: Payer: Self-pay | Admitting: Family Medicine

## 2021-03-28 NOTE — Telephone Encounter (Signed)
Called patient to schedule appointment, there was no answer to the phone call so a voicemail was left with the call back number for the office.  

## 2021-04-15 ENCOUNTER — Telehealth: Payer: Self-pay | Admitting: *Deleted

## 2021-04-15 NOTE — Telephone Encounter (Signed)
I called Beth Rollins to remind her of next [facility] appointment on 04/1521 at 2pm and reached her voicemail. I left the message on her voicemail. She also needs her [place] rescheduled. ?[name] ?

## 2021-04-20 ENCOUNTER — Encounter: Payer: Self-pay | Admitting: Family Medicine

## 2021-04-21 ENCOUNTER — Telehealth: Payer: Self-pay | Admitting: *Deleted

## 2021-04-21 ENCOUNTER — Encounter: Payer: Self-pay | Admitting: *Deleted

## 2021-04-21 NOTE — Telephone Encounter (Signed)
[facility] prenatal visit 04/20/21. I called her home/mobile and heard message mailbox is full and cannot accept messages.  ?I called her contact number ( spouse ) and he answered . I asked if I could speak with Lajada. He informed me she is overseas. I asked if he knew when she would be back so we could reschedule appointment. He states he does not know when she will be back. I asked him to tell her to call us to reschedule when she gets back. ?I will also send a MyChart message. ?When she calls back if it is not close to Cent[facility]regnancy appointment she needs to be scheduled for one on one with provider Alvester Morin if possible, other if not). ?[name] ?

## 2021-10-31 ENCOUNTER — Encounter: Payer: Self-pay | Admitting: *Deleted

## 2022-01-19 ENCOUNTER — Encounter: Payer: Self-pay | Admitting: *Deleted
